# Patient Record
Sex: Female | Born: 1960 | Race: Black or African American | Hispanic: No | Marital: Single | Smoking: Former smoker
Health system: Southern US, Community
[De-identification: ages and names within clinical notes are randomized; demographics above are authoritative.]

## PROBLEM LIST (undated history)

## (undated) DIAGNOSIS — D573 Sickle-cell trait: Secondary | ICD-10-CM

## (undated) DIAGNOSIS — C539 Malignant neoplasm of cervix uteri, unspecified: Secondary | ICD-10-CM

## (undated) DIAGNOSIS — E119 Type 2 diabetes mellitus without complications: Secondary | ICD-10-CM

## (undated) DIAGNOSIS — I10 Essential (primary) hypertension: Secondary | ICD-10-CM

## (undated) DIAGNOSIS — M1990 Unspecified osteoarthritis, unspecified site: Secondary | ICD-10-CM

## (undated) DIAGNOSIS — E785 Hyperlipidemia, unspecified: Secondary | ICD-10-CM

## (undated) HISTORY — DX: Type 2 diabetes mellitus without complications: E11.9

## (undated) HISTORY — DX: Unspecified osteoarthritis, unspecified site: M19.90

## (undated) HISTORY — DX: Essential (primary) hypertension: I10

## (undated) HISTORY — DX: Malignant neoplasm of cervix uteri, unspecified: C53.9

## (undated) HISTORY — DX: Hyperlipidemia, unspecified: E78.5

## (undated) HISTORY — PX: BACK SURGERY: SHX140

## (undated) HISTORY — DX: Sickle-cell trait: D57.3

## (undated) HISTORY — PX: JOINT REPLACEMENT: SHX530

## (undated) HISTORY — PX: COLONOSCOPY: SHX174

---

## 1998-09-02 ENCOUNTER — Other Ambulatory Visit: Admission: RE | Admit: 1998-09-02 | Discharge: 1998-09-02 | Payer: Self-pay | Admitting: Obstetrics and Gynecology

## 1998-09-05 ENCOUNTER — Inpatient Hospital Stay (HOSPITAL_COMMUNITY): Admission: AD | Admit: 1998-09-05 | Discharge: 1998-09-05 | Payer: Self-pay | Admitting: Obstetrics and Gynecology

## 1998-09-05 ENCOUNTER — Inpatient Hospital Stay (HOSPITAL_COMMUNITY): Admission: AD | Admit: 1998-09-05 | Discharge: 1998-09-05 | Payer: Self-pay | Admitting: *Deleted

## 1998-09-20 ENCOUNTER — Other Ambulatory Visit: Admission: RE | Admit: 1998-09-20 | Discharge: 1998-09-20 | Payer: Self-pay | Admitting: Obstetrics and Gynecology

## 1998-09-22 ENCOUNTER — Ambulatory Visit: Admission: RE | Admit: 1998-09-22 | Discharge: 1998-09-22 | Payer: Self-pay | Admitting: Gynecology

## 1998-09-22 ENCOUNTER — Encounter: Payer: Self-pay | Admitting: Gynecology

## 1998-09-25 ENCOUNTER — Encounter: Payer: Self-pay | Admitting: Obstetrics and Gynecology

## 1998-09-25 ENCOUNTER — Ambulatory Visit (HOSPITAL_COMMUNITY): Admission: RE | Admit: 1998-09-25 | Discharge: 1998-09-25 | Payer: Self-pay | Admitting: Obstetrics and Gynecology

## 1998-09-29 ENCOUNTER — Encounter: Payer: Self-pay | Admitting: Gynecology

## 1998-09-29 ENCOUNTER — Ambulatory Visit: Admission: RE | Admit: 1998-09-29 | Discharge: 1998-09-29 | Payer: Self-pay | Admitting: Gynecology

## 1998-10-20 ENCOUNTER — Ambulatory Visit: Admission: RE | Admit: 1998-10-20 | Discharge: 1998-10-20 | Payer: Self-pay | Admitting: Gynecology

## 1998-11-01 ENCOUNTER — Encounter: Admission: RE | Admit: 1998-11-01 | Discharge: 1999-01-30 | Payer: Self-pay | Admitting: Radiation Oncology

## 1998-11-02 ENCOUNTER — Ambulatory Visit: Admission: RE | Admit: 1998-11-02 | Discharge: 1998-11-02 | Payer: Self-pay | Admitting: Gynecology

## 1998-11-17 ENCOUNTER — Ambulatory Visit: Admission: RE | Admit: 1998-11-17 | Discharge: 1998-11-17 | Payer: Self-pay | Admitting: Gynecology

## 1999-01-11 ENCOUNTER — Ambulatory Visit: Admission: RE | Admit: 1999-01-11 | Discharge: 1999-01-11 | Payer: Self-pay | Admitting: Gynecologic Oncology

## 1999-01-25 ENCOUNTER — Ambulatory Visit: Admission: RE | Admit: 1999-01-25 | Discharge: 1999-01-25 | Payer: Self-pay | Admitting: Gynecology

## 1999-04-12 ENCOUNTER — Other Ambulatory Visit: Admission: RE | Admit: 1999-04-12 | Discharge: 1999-04-12 | Payer: Self-pay | Admitting: Gynecology

## 1999-04-12 ENCOUNTER — Ambulatory Visit: Admission: RE | Admit: 1999-04-12 | Discharge: 1999-04-12 | Payer: Self-pay | Admitting: Gynecology

## 1999-07-13 ENCOUNTER — Ambulatory Visit: Admission: RE | Admit: 1999-07-13 | Discharge: 1999-07-13 | Payer: Self-pay | Admitting: Gynecologic Oncology

## 1999-07-15 ENCOUNTER — Other Ambulatory Visit: Admission: RE | Admit: 1999-07-15 | Discharge: 1999-07-15 | Payer: Self-pay | Admitting: Gynecologic Oncology

## 1999-10-18 ENCOUNTER — Ambulatory Visit: Admission: RE | Admit: 1999-10-18 | Discharge: 1999-10-18 | Payer: Self-pay | Admitting: Gynecology

## 1999-10-19 ENCOUNTER — Other Ambulatory Visit: Admission: RE | Admit: 1999-10-19 | Discharge: 1999-10-19 | Payer: Self-pay | Admitting: Gynecology

## 2000-01-24 ENCOUNTER — Ambulatory Visit (HOSPITAL_COMMUNITY): Admission: RE | Admit: 2000-01-24 | Discharge: 2000-01-24 | Payer: Self-pay | Admitting: *Deleted

## 2000-01-24 ENCOUNTER — Encounter: Payer: Self-pay | Admitting: *Deleted

## 2000-03-28 ENCOUNTER — Ambulatory Visit: Admission: RE | Admit: 2000-03-28 | Discharge: 2000-03-28 | Payer: Self-pay | Admitting: Gynecology

## 2000-03-28 ENCOUNTER — Other Ambulatory Visit: Admission: RE | Admit: 2000-03-28 | Discharge: 2000-03-28 | Payer: Self-pay | Admitting: Gynecology

## 2000-07-18 ENCOUNTER — Other Ambulatory Visit: Admission: RE | Admit: 2000-07-18 | Discharge: 2000-07-18 | Payer: Self-pay | Admitting: Gynecology

## 2000-07-18 ENCOUNTER — Ambulatory Visit: Admission: RE | Admit: 2000-07-18 | Discharge: 2000-07-18 | Payer: Self-pay | Admitting: Gynecology

## 2001-02-27 ENCOUNTER — Other Ambulatory Visit: Admission: RE | Admit: 2001-02-27 | Discharge: 2001-02-27 | Payer: Self-pay | Admitting: Gynecology

## 2001-02-27 ENCOUNTER — Ambulatory Visit: Admission: RE | Admit: 2001-02-27 | Discharge: 2001-02-27 | Payer: Self-pay | Admitting: Gynecology

## 2001-03-11 ENCOUNTER — Ambulatory Visit (HOSPITAL_COMMUNITY): Admission: RE | Admit: 2001-03-11 | Discharge: 2001-03-11 | Payer: Self-pay | Admitting: *Deleted

## 2001-06-04 ENCOUNTER — Ambulatory Visit: Admission: RE | Admit: 2001-06-04 | Discharge: 2001-06-04 | Payer: Self-pay | Admitting: Gynecology

## 2001-06-04 ENCOUNTER — Encounter (INDEPENDENT_AMBULATORY_CARE_PROVIDER_SITE_OTHER): Payer: Self-pay | Admitting: *Deleted

## 2001-06-04 ENCOUNTER — Other Ambulatory Visit: Admission: RE | Admit: 2001-06-04 | Discharge: 2001-06-04 | Payer: Self-pay | Admitting: Gynecology

## 2002-01-29 ENCOUNTER — Other Ambulatory Visit: Admission: RE | Admit: 2002-01-29 | Discharge: 2002-01-29 | Payer: Self-pay | Admitting: Gynecologic Oncology

## 2002-01-29 ENCOUNTER — Ambulatory Visit: Admission: RE | Admit: 2002-01-29 | Discharge: 2002-01-29 | Payer: Self-pay | Admitting: Gynecologic Oncology

## 2002-03-17 ENCOUNTER — Ambulatory Visit (HOSPITAL_COMMUNITY): Admission: RE | Admit: 2002-03-17 | Discharge: 2002-03-17 | Payer: Self-pay | Admitting: Gynecologic Oncology

## 2002-06-11 ENCOUNTER — Other Ambulatory Visit: Admission: RE | Admit: 2002-06-11 | Discharge: 2002-06-11 | Payer: Self-pay | Admitting: Gynecology

## 2002-06-11 ENCOUNTER — Ambulatory Visit: Admission: RE | Admit: 2002-06-11 | Discharge: 2002-06-11 | Payer: Self-pay | Admitting: Gynecology

## 2002-09-03 ENCOUNTER — Emergency Department (HOSPITAL_COMMUNITY): Admission: EM | Admit: 2002-09-03 | Discharge: 2002-09-03 | Payer: Self-pay | Admitting: *Deleted

## 2002-10-16 ENCOUNTER — Encounter: Payer: Self-pay | Admitting: Orthopedic Surgery

## 2002-10-21 ENCOUNTER — Inpatient Hospital Stay (HOSPITAL_COMMUNITY): Admission: RE | Admit: 2002-10-21 | Discharge: 2002-10-25 | Payer: Self-pay | Admitting: Orthopedic Surgery

## 2002-10-21 ENCOUNTER — Encounter: Payer: Self-pay | Admitting: Orthopedic Surgery

## 2002-10-22 ENCOUNTER — Encounter: Payer: Self-pay | Admitting: Orthopedic Surgery

## 2003-01-06 ENCOUNTER — Ambulatory Visit: Admission: RE | Admit: 2003-01-06 | Discharge: 2003-01-06 | Payer: Self-pay | Admitting: Gynecology

## 2003-01-06 ENCOUNTER — Encounter (INDEPENDENT_AMBULATORY_CARE_PROVIDER_SITE_OTHER): Payer: Self-pay | Admitting: *Deleted

## 2003-01-06 ENCOUNTER — Other Ambulatory Visit: Admission: RE | Admit: 2003-01-06 | Discharge: 2003-01-06 | Payer: Self-pay | Admitting: Gynecology

## 2003-01-26 ENCOUNTER — Encounter: Payer: Self-pay | Admitting: Orthopedic Surgery

## 2003-01-26 ENCOUNTER — Encounter: Admission: RE | Admit: 2003-01-26 | Discharge: 2003-01-26 | Payer: Self-pay | Admitting: Orthopedic Surgery

## 2003-03-23 ENCOUNTER — Encounter: Payer: Self-pay | Admitting: Gynecology

## 2003-03-23 ENCOUNTER — Ambulatory Visit (HOSPITAL_COMMUNITY): Admission: RE | Admit: 2003-03-23 | Discharge: 2003-03-23 | Payer: Self-pay | Admitting: Gynecology

## 2003-04-27 ENCOUNTER — Encounter: Admission: RE | Admit: 2003-04-27 | Discharge: 2003-04-27 | Payer: Self-pay | Admitting: Orthopedic Surgery

## 2003-05-01 ENCOUNTER — Ambulatory Visit (HOSPITAL_COMMUNITY): Admission: RE | Admit: 2003-05-01 | Discharge: 2003-05-01 | Payer: Self-pay | Admitting: Orthopedic Surgery

## 2004-03-23 ENCOUNTER — Ambulatory Visit (HOSPITAL_COMMUNITY): Admission: RE | Admit: 2004-03-23 | Discharge: 2004-03-23 | Payer: Self-pay | Admitting: Gynecology

## 2004-08-03 ENCOUNTER — Encounter (INDEPENDENT_AMBULATORY_CARE_PROVIDER_SITE_OTHER): Payer: Self-pay | Admitting: *Deleted

## 2004-08-03 LAB — CONVERTED CEMR LAB

## 2004-08-09 ENCOUNTER — Ambulatory Visit: Payer: Self-pay | Admitting: Family Medicine

## 2004-08-09 ENCOUNTER — Encounter (INDEPENDENT_AMBULATORY_CARE_PROVIDER_SITE_OTHER): Payer: Self-pay | Admitting: Specialist

## 2005-03-29 ENCOUNTER — Emergency Department (HOSPITAL_COMMUNITY): Admission: EM | Admit: 2005-03-29 | Discharge: 2005-03-29 | Payer: Self-pay | Admitting: Emergency Medicine

## 2005-04-17 ENCOUNTER — Emergency Department (HOSPITAL_COMMUNITY): Admission: EM | Admit: 2005-04-17 | Discharge: 2005-04-17 | Payer: Self-pay | Admitting: Family Medicine

## 2005-05-08 ENCOUNTER — Ambulatory Visit (HOSPITAL_COMMUNITY): Admission: RE | Admit: 2005-05-08 | Discharge: 2005-05-08 | Payer: Self-pay | Admitting: Gynecology

## 2005-06-05 DIAGNOSIS — C539 Malignant neoplasm of cervix uteri, unspecified: Secondary | ICD-10-CM

## 2005-06-05 HISTORY — PX: ABDOMINAL HYSTERECTOMY: SHX81

## 2005-06-05 HISTORY — DX: Malignant neoplasm of cervix uteri, unspecified: C53.9

## 2006-02-14 ENCOUNTER — Emergency Department (HOSPITAL_COMMUNITY): Admission: EM | Admit: 2006-02-14 | Discharge: 2006-02-14 | Payer: Self-pay | Admitting: Emergency Medicine

## 2006-05-21 ENCOUNTER — Ambulatory Visit (HOSPITAL_COMMUNITY): Admission: RE | Admit: 2006-05-21 | Discharge: 2006-05-21 | Payer: Self-pay | Admitting: Gynecology

## 2006-08-02 DIAGNOSIS — M25559 Pain in unspecified hip: Secondary | ICD-10-CM | POA: Insufficient documentation

## 2006-08-02 DIAGNOSIS — C539 Malignant neoplasm of cervix uteri, unspecified: Secondary | ICD-10-CM | POA: Insufficient documentation

## 2006-08-03 ENCOUNTER — Encounter (INDEPENDENT_AMBULATORY_CARE_PROVIDER_SITE_OTHER): Payer: Self-pay | Admitting: *Deleted

## 2007-08-13 ENCOUNTER — Emergency Department (HOSPITAL_COMMUNITY): Admission: EM | Admit: 2007-08-13 | Discharge: 2007-08-13 | Payer: Self-pay | Admitting: Family Medicine

## 2007-09-16 ENCOUNTER — Emergency Department (HOSPITAL_COMMUNITY): Admission: EM | Admit: 2007-09-16 | Discharge: 2007-09-16 | Payer: Self-pay | Admitting: Family Medicine

## 2008-12-20 ENCOUNTER — Emergency Department (HOSPITAL_COMMUNITY): Admission: EM | Admit: 2008-12-20 | Discharge: 2008-12-20 | Payer: Self-pay | Admitting: Emergency Medicine

## 2008-12-21 ENCOUNTER — Emergency Department (HOSPITAL_COMMUNITY): Admission: EM | Admit: 2008-12-21 | Discharge: 2008-12-21 | Payer: Self-pay | Admitting: Family Medicine

## 2009-02-01 ENCOUNTER — Emergency Department (HOSPITAL_COMMUNITY): Admission: EM | Admit: 2009-02-01 | Discharge: 2009-02-01 | Payer: Self-pay | Admitting: Family Medicine

## 2009-08-24 ENCOUNTER — Ambulatory Visit (HOSPITAL_COMMUNITY): Admission: RE | Admit: 2009-08-24 | Discharge: 2009-08-24 | Payer: Self-pay | Admitting: Obstetrics & Gynecology

## 2010-06-25 ENCOUNTER — Encounter: Payer: Self-pay | Admitting: Orthopedic Surgery

## 2010-06-26 ENCOUNTER — Encounter: Payer: Self-pay | Admitting: Gynecology

## 2010-06-26 ENCOUNTER — Encounter: Payer: Self-pay | Admitting: Obstetrics

## 2010-10-21 NOTE — Consult Note (Signed)
NAMESIHAAM, CHROBAK NO.:  [id number]   MEDICAL RECORD NO.:  [id number]                   PATIENT TYPE:  OUT   LOCATION:  GYN                                  FACILITY:  [facility]   PHYSICIAN:  De Blanch, M.D.         DATE OF BIRTH:  04/08/1961   DATE OF CONSULTATION:  06/11/2002  DATE OF DISCHARGE:  06/11/2002                                   CONSULTATION   A 50 year old white female returns for continuing follow-up of a stage IB  cervical carcinoma.  She underwent radical hysterectomy in May of 2000 and  she had a metastasis in January 2004 right pelvic lymph nodes and deep  stromal invasion.  She subsequently received postoperative radiation therapy  completed in July of 2000.  She did not chose to take concurrent  chemotherapy.  She has done reasonably well, although she has had some  difficulties with her bladder and has undergone initial evaluation by Excell Seltzer. Annabell Howells, M.D., although she did not follow through with the complete work-  up.   The patient reports she does have some vaginal bleeding occurring for one  day approximately every two to three months.  She has noticed that this is  not associated with intercourse and is painless.  Work-up to date of this  problem has been nonrevealing.   The patient reports she has no GI symptoms.  Her urinary tract symptoms with  regard to urinary frequency and nocturia and intermittent hematuria seem to  be stable.  Overall, she seems to be doing reasonably well.   FAMILY HISTORY:  Reviewed and unchanged.   SOCIAL HISTORY:  Reviewed and unchanged.  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   REVIEW OF SYSTEMS:  GENITOURINARY:  Noted above.  She has no other GI,  cardiovascular, pulmonary, neurologic, or musculoskeletal symptoms.   PHYSICAL EXAMINATION:  VITAL SIGNS:  Weight 240 pounds, blood pressure  118/80.  GENERAL:  The patient is a healthy African-American female in  no acute  distress.  HEENT:  Negative.  NECK:  Supple without thyromegaly.  LYMPH:  There is no supraclavicular or inguinal adenopathy.  ABDOMEN:  Soft, nontender.  No masses, organomegaly, ascites, or hernias are  noted.  PELVIC:  EGBUS is normal.  The vagina is reasonably well estrogenized,  although near the apex it is somewhat thinner and appears to be slightly  more friable.  No lesions are noted.  There is no nodularity.  Bimanual and  rectovaginal examination reveal no masses, induration, or nodularity.   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  No evidence of recurrent  disease.   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   The patient is given new prescription for Premarin vaginal cream and  encouraged to use this on a regular basis at  least twice a week.  She also  notes some areas of erythema and excoriation beneath her panniculus and  breasts.  She is given a prescription for Nycop to be used b.i.d. in these  areas.  She will return to see [place] in six months.   Finally, she is encouraged to rearrange appointment with Excell Seltzer. Annabell Howells, M.D.  so she can have further evaluation of her bladder problems.                                               De Blanch, M.D.    DC/MEDQ  D:  06/16/2002  T:  06/16/2002  Job:  [id number]   cc:   Excell Seltzer. Annabell Howells, M.D.  [address]  [place]  [place] [address]  Fax: [phone]   Telford Nab, R.N.  [address] [place], [place] [address]  Fax: 1   Billie Lade, M.D.  [address] [facility]  [place]  [place]  [address]  Fax: [phone]   Dineen Kid. Rana Snare, M.D.  [address]  [place]  [place] [address]  Fax: [phone]

## 2010-10-21 NOTE — Consult Note (Signed)
[facility]  Patient:    [name], [name]                       MRN: [id number] Proc. Date: 03/28/00 Adm. Date:  [id number] Disc. Date: [id number] Attending:  Jeannette Corpus                          Consultation Report  GYNECOLOGIC ONCOLOGY CLINIC  [age]-year-old African-American female returns for continuing followup of stage IB cervical carcinoma.  She underwent a radical hysterectomy in May of 2000.  She was found to have metastatic disease in one of four right pelvis lymph nodes.  Her tumor was also deeply invasive; therefore, she received postoperative pelvic radiation therapy to a total dose of 4500 cGy, completed in July of 2000.  Patient refused to take on current chemotherapy despite our strong recommendations.  Since our last visit with Ms. Viets, she has done reasonably well.  She is concerned about her weight gain and a decrease in her libido.  She also has some urinary frequency but no dysuria.  REVIEW OF SYSTEMS:  Review of systems is otherwise negative.  FAMILY HISTORY AND SOCIAL HISTORY:  Reviewed and unchanged.  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  PHYSICAL EXAMINATION  VITAL SIGNS:  Weight 235 pounds (up 11 pounds since May of 2001).  GENERAL:  The patient is a healthy black female in no acute distress.  HEENT:  Negative.  NECK:  Supple without thyromegaly.  There is no supraclavicular, axillary or inguinal adenopathy.  ABDOMEN:  The abdomen is obese, soft and nontender.  No masses, organomegaly, ascites or herniae are noted.  Her incision is well-healed.  PELVIC:  EG/BUS normal.  Vagina is clean, well-supported.  Bimanual and rectovaginal exam revealed no mass, induration or nodularity.  IMPRESSION:  Stage IB cervical cancer, status post radical hysterectomy and pelvic radiation therapy; no evidence of recurrent disease.  PLAN:  Pap smears are obtained today.  Because of her urinary symptoms, a urinalysis is  obtained.  With regard to the patients decreased libido, it also is noted she is having hot flushes; she is therefore given a prescription for Estratest HS and Premarin vaginal cream to be used twice a week.  She will return to see [place] again in six months for continued followup. DD:  04/10/00 TD:  04/10/00 Job: [id number]

## 2010-10-21 NOTE — Consult Note (Signed)
[facility]  Patient:    [name], [name]                         MRN: [id number] Proc. Date: 10/18/99 Attending:  Rande Brunt. Clarke-Pearson, M.D. CC:         Billie Lade, M.D.             Trevor Iha, M.D.             Telford Nab, R.N.                          Consultation Report  HISTORY OF PRESENT ILLNESS:  This 50 year old African-American female returns for continuing follow-up of stage IB cervical carcinoma. She underwent a radical hysterectomy in May of 2000. She was found to have metastatic disease in 1 of 4 right pelvic lymph nodes and a deeply invasive tumor. She received postoperative pelvic radiation therapy delivered to the whole pelvis (4500 cGy) which she completed on December 22, 1998. She refused to receive concurrent chemotherapy.  Since her last visit, she has done well. She denies any GI symptoms. She has some vaginal discharge and slight burning when she urinates. She denies any flank pain or fever or nocturia.  FAMILY AND SOCIAL HISTORY:  Reviewed and unchanged from note of May 2000 . 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  ALLERGIES:  PENICILLIN.  CURRENT MEDICATIONS:  None.  PHYSICAL EXAMINATION:  VITAL SIGNS:  Weight 224 pounds (up 6 pounds since February), blood pressure 130/80.  GENERAL:  The patient is a healthy, obese black female in no acute distress.  HEENT:  Negative.  NECK:  Supple without thyromegaly. There is no supraclavicular, axillary or inguinal adenopathy. There is no CVA tenderness.  ABDOMEN:  Obese and soft, nontender. No mass, organomegaly, ascites or hernias are noted.  PELVIC:  EGBUS normal. Vagina has a nonspecific vaginitis discharge. There are no lesions noted. Bimanual and rectovaginal exam reveal no masses, indurations or nodularity.  EXTREMITIES:  The lower extremities are without edema or varicosities.  IMPRESSION:  Stage IB squamous cell  carcinoma of the cervix status post radical hysterectomy followed by whole pelvis radiation therapy. She remains without evidence of disease.  Nonspecific vaginitis.  PLAN:  Pap smears are obtained. The patient is given a prescription for Flagyl 250 mg t.i.d. for 5 days. She will return to see me in 4 months for continuing follow-up. DD:  10/18/99 TD:  10/18/99 Job: [id number]

## 2010-10-21 NOTE — Discharge Summary (Signed)
   [name], [name]                          ACCOUNT NO.:  [id number]   MEDICAL RECORD NO.:  [id number]                   PATIENT TYPE:  INP   LOCATION:  5002                                 FACILITY:  [facility]   PHYSICIAN:  Burnard Bunting, M.D.                 DATE OF BIRTH:  May 09, 1961   DATE OF ADMISSION:  10/21/2002  DATE OF DISCHARGE:  10/25/2002                                 DISCHARGE SUMMARY   DISCHARGE DIAGNOSIS:  Left hip arthritis.   SECONDARY DIAGNOSES:  History of cervical cancer status post radiation.   OPERATIONS/PROCEDURE:  Left total hip arthroplasty with hardware removal  performed Oct 21, 2002.   HOSPITAL COURSE:  The patient was admitted to the orthopedic service Oct 21, 2002.  At that time, she underwent hardware removal and left total hip  arthroplasty.  The patient tolerated the procedure well without immediate  complications.  She did have some left upper arm and chest pain on  postoperative day #1 which was evaluated by the medical service.  This was  negative for any type of cardiac event.  On postoperative day #1, her leg  lengths were  equal.  Dorsiflexion and plantar flexion was intact.  The  patient's hematocrit was 27.2 on Oct 23, 2002.  She was maintained on  Coumadin for DVT prophylaxis as well as bilateral foot pumps.  Incision was  intact at the time of discharge.  X-ray showed components to be in good  position.  The patient was discharged on Oct 25, 2002 in good condition.  She will be partial weightbearing on that leg for three weeks.   DISCHARGE MEDICATIONS:  Include Percocet and Coumadin as well as admission  medications.   She will return to clinic in one week for suture removal.  She is instructed  with hip precaution by physical therapy.                                                 Burnard Bunting, M.D.    GSD/MEDQ  D:  11/24/2002  T:  11/25/2002  Job:  [id number]

## 2010-10-21 NOTE — Consult Note (Signed)
NAMECYTHINA, [name]                          ACCOUNT NO.:  [id number]   MEDICAL RECORD NO.:  [id number]                   PATIENT TYPE:  OUT   LOCATION:  GYN                                  FACILITY:  [facility]   PHYSICIAN:  John T. Kyla Balzarine, M.D.                 DATE OF BIRTH:  Jul 07, 1960   DATE OF CONSULTATION:  DATE OF DISCHARGE:                                 GYN CONSULTATION   CHIEF COMPLAINT:  Ongoing followup of cervical cancer with intermittent  vaginal bleeding.   INTERVAL HISTORY:  The patient notes intermittent vaginal spotting and  occasional blood in urine.  She denies any trauma, and denies postcoital  bleeding.  She has been using oral and vaginal Premarin, although the  vaginal Premarin has been somewhat intermittent, particularly revolving  around a hip repair for avascular necrosis which took place last spring at  [facility].   PAST GYNECOLOGICAL ONCOLOGY HISTORY:  Stage 1B cervical cancer treated by  radical hysterectomy in 5/00, treated with 4500 centigray of the whole  pelvis completed in 7/00.  She has been known to have radiation change at  the upper vagina.   MEDICATIONS:  Premarin, oral and vaginal.   PAST SURGICAL HISTORY:  1. Radical hysterectomy.  2. 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   ALLERGIES:  PENICILLIN.  Past history, personal and social history, family history, and review of  systems are unchanged from those recorded during our inpatient evaluation.   PHYSICAL EXAMINATION:  VITAL SIGNS:  Weight 238 pounds, and vital signs are  stable/afebrile.  GENERAL:  The patient is alert and oriented x3, in no acute distress.  HEENT:  Benign with a clear oropharynx.  NECK:  There is no pathologic lymphadenopathy.  BACK:  There is no back or CVA tenderness.  ABDOMEN:  Soft and benign without ascites, mass, or organomegaly.  Incision  is well-healed.  No hernias.  Nontender.  EXTREMITIES:  Full range of motion.  There is scarring along the left  lateral hip from prior hip repair.  PELVIC:  External genitalia, BUS are normal to inspection and palpable.  The  bladder and urethra are well supported by the vagina and there is minimal  erythema at the apex, with some contact spotting, but no overt viability.  The patient is absent uterus and cervix.  Perimetria and adnexa, no mass or  nodularity.  RECTAL:  Confirmatory.   ASSESSMENT:  Cervical carcinoma, NED.  With likely radiation effect in the  upper vagina; normal examination.    PLAN:  1. The patient will increase her Premarin vaginal cream to 1 g b.i.w.  2. She will return for routine evaluation in our unit in one year.  John T. Kyla Balzarine, M.D.    JTS/MEDQ  D:  01/29/2002  T:  01/30/2002  Job:  [id number]   cc:   Telford Nab, R.N.

## 2010-10-21 NOTE — Op Note (Signed)
NAMETALLI, KIMMER                          ACCOUNT NO.:  [id number]   MEDICAL RECORD NO.:  [id number]                   PATIENT TYPE:  INP   LOCATION:  5002                                 FACILITY:  [facility]   PHYSICIAN:  Burnard Bunting, M.D.                 DATE OF BIRTH:  1961/03/19   DATE OF PROCEDURE:  10/21/2002  DATE OF DISCHARGE:                                 OPERATIVE REPORT   PREOPERATIVE DIAGNOSIS:  Left hip arthritis, status post failed free  vascularized fibular grafting.   POSTOPERATIVE DIAGNOSIS:  Left hip arthritis, status post failed free  vascularized fibular grafting.   PROCEDURE:  Left total hip arthroplasty utilizing Press-Fit component size  #50 acetabular cup with ceramic and size #814 Press-Fit thin #2 aluminum  hardware, left hip.   SURGEON:  Burnard Bunting, M.D.   ANESTHESIA:  General endotracheal.   ESTIMATED BLOOD LOSS:  500 mL.   DRAINS:  None.   DESCRIPTION OF PROCEDURE:  The patient was brought to the operating room  where general endotracheal anesthesia was induced.  Preoperative IV  antibiotics were administered.  The left hip and leg and foot were prepped  with DuraPrep solution and draped in a sterile manner.  The patient was  placed in the lateral position with an axillary roll placed under the right  axilla, and the peroneal nerve on the right was well-padded.  The leg, hip  and foot were then prepped with DuraPrep solution and draped in a sterile  manner.  Collier Flowers was used to cover the operative field.  A posterior approach  to the hip was utilized.  The skin and subcutaneous tissue were sharply  divided.  The fascia lata was encountered.  With the hip abducted the fascia  lata was divided.  Significant scar tissue was adherent to the trochanteric  bursa.  This was then developed over the plane with the Cobb elevator.  The  sciatic nerve was palpated and protected at all times during the remaining  portion of the case.  The external  rotators were identified.  The piriformis  tendon was tagged and retracted.  The external rotators were then detached  off of the capsule using a Cobb elevator and electrocautery.  The capsule  was then identified and divided as a T-shaped flap from the acetabulum and  tagged with two #1 sutures.  The hip was then dislocated.  The femoral neck  cut was then made according to the preoperative templating about a  fingerbreadth proximal to the graft in the left trochanter.  This was  performed with the oscillating saw with the __________ retractor around the  femoral neck.  The head was removed.  Segmental collapse was noted.  At this  time the femoral canal was prepared using a bur.  The remaining portion of  the fibula was burred down.  This required intramedullary placement of the  bur in order to remove the hard fibular bone from the canal.  The canal was  then subsequently reamed and broached up to accept a size #8 stem.  At this  time the attention was directed towards the acetabulum.  The labrum was  removed.  The bony border of the acetabulum was identified.  The acetabulum  was then reamed to accept a size #50 cup.  A trial cup was placed, and found  to have excellent Press-Fit.  The true cup was then tapped into position  with bottoming-out noted.  The trial liner was then placed.  At this time  the size #8 broach was placed and the hip was reduced and was found to be  stable in both +0 and +5 heads.  At this time the distal femoral canal was  reamed up to a size #14.5 with good __________ encountered.  At this time  the cold handle broach was placed and the true component was then tapped  into position.  The Press-Fit femur was then tapped in at about 20 degrees  of anteversion.  The hip was again reduced with a +0 and +5 head and found  to be stable in external rotation, abduction, stable in the position of  sleep, stable with 90 degrees abduction, about 60 degrees of internal   rotation.  At this time a postoperative fluoroscopy was utilized to confirm  good leg lengths and the placement of the cup and the femur.  The incision  was thoroughly irrigated at this time and the true component was placed.  A  +5 head gave excellent stability and good offset.  The hip stability  parameters were maintained.  At this time the +5 ceramic head was placed.  The head was tapped into position.  Stability parameters were the same.  The  joint was then again irrigated with pulsatile lavage.  The split in the T-  capsule was then reapproximated.  The fascia lata was then reapproximated.  It was then reapproximated using a #1 Vicryl suture.  The sciatic nerve was  palpated and was found to have a good tension with the leg flexion and  extension.  The skin was closed using inverted #0 and #2-0 Vicryl sutures,  followed by skin staples.  An impervious dressing was then placed.  The patient was then transferred to the recovery room in stable condition.                                                 Burnard Bunting, M.D.    GSD/MEDQ  D:  10/21/2002  T:  10/21/2002  Job:  [id number]

## 2010-10-21 NOTE — Consult Note (Signed)
[name], [name] NO.:  [id number]   MEDICAL RECORD NO.:  [id number]                   PATIENT TYPE:  OUT   LOCATION:  GYN                                  FACILITY:  [facility]   PHYSICIAN:  De Blanch, M.D.         DATE OF BIRTH:  Feb 27, 1961   DATE OF CONSULTATION:  DATE OF DISCHARGE:  01/06/2003                                   CONSULTATION   REASON FOR CONSULTATION:  A 51 year old African-American female who returns  for continuing follow-up of cervical cancer.   INTERVAL HISTORY:  Since her last visit the patient has had no gynecologic  symptoms.  She specifically denies any pelvic pain, pressure, GI, or GU  symptoms.  She has no vaginal bleeding.   She has recently had a left total hip replacement performed in [place]  and she seems to be recovering from this well.   HISTORY OF PRESENT ILLNESS:  The patient underwent radical hysterectomy in  May 2000 for a stage Ib cervical cancer.  She had metastasis to pelvic lymph  nodes and subsequently received whole pelvis radiation therapy which was  completed in July 2000.  She refused concurrent chemotherapy during that  initial treatment.   PAST MEDICAL HISTORY:  Obesity and hypertension.   CURRENT MEDICATIONS:  Pain medication for hip pain.   PAST SURGICAL HISTORY:  1. Radical hysterectomy in 2000.  2. Bone graft to her hip, and subsequent total hip replacement.   SOCIAL HISTORY:  The patient smokes less than a pack a day.  She works as a  Financial risk analyst.  Does not drink.   FAMILY HISTORY:  Negative for gynecologic or colon cancer.  Her mother died  of breast cancer.   REVIEW OF SYSTEMS:  Negative except as noted above.   PHYSICAL EXAMINATION:  VITAL SIGNS:  Weight 243 pounds, blood pressure  123/88, pulse 96.  GENERAL:  The patient is a healthy African-American female in no acute  distress.  HEENT:  Negative.  NECK:  Supple without thyromegaly.  There is no supraclavicular or  inguinal  adenopathy.  ABDOMEN:  Obese, soft, nontender.  No mass, organomegaly, ascites, or  hernias noted.  The midline is well healed.  PELVIC:  EG/BUS, vagina, bladder, urethra are normal.  Cervix and uterus is  surgically absent.  There are no lesions in the vagina.  Bimanual reveals  radiation fibrosis without any nodularity or masses.  Rectovaginal exam  confirms.    IMPRESSION:  Stage Ib cervical cancer status post radical hysterectomy and  adjuvant radiation therapy for positive pelvic lymph nodes, completed in  July 2000.  The patient is now four years following completion of therapy  and is doing well.   Pap smears were obtained today.  She will return to see [place] in one year for  continuing surveillance.  De Blanch, M.D.    DC/MEDQ  D:  01/08/2003  T:  01/08/2003  Job:  [id number]   cc:   Telford Nab, R.N.  [address]  [place], [place] [address]   Excell Seltzer. Annabell Howells, M.D.  [address]  [place]  [place] [address]  Fax: [phone]

## 2010-10-21 NOTE — Consult Note (Signed)
[facility]  Patient:    [name], [name] Visit Number: [id number] MRN: [id number]          Service Type: GON Location: GYN Attending Physician:  Jeannette Corpus Dictated by:   Rande Brunt. Clarke-Pearson, M.D. Proc. Date: 06/04/01 Admit Date:  06/04/2001                            Consultation Report  REASON FOR FOLLOWUP:  [age]-year-old black female returns today because of recurrent bad episodes of vaginal bleeding.  This has occurred on a couple of occasions over the past two weeks.  She denies any pelvic pain, pressure or any new bladder symptoms.  She does have some chronic bladder dysfunction but this is stable.  She denies any trauma and the bleeding did not occur after intercourse.  She is not 100% sure whether this is from her vagina or from her bladder.  She does not really have any other new bladder symptoms.  REVIEW OF SYSTEMS:  Review of systems is essentially negative.  She is scheduled to have hip surgery at [facility] later this coming month.  FAMILY HISTORY AND SOCIAL HISTORY:  Unchanged.  PHYSICAL EXAMINATION:  VITAL SIGNS:  Weight 234 pounds (up 8 pounds since last visit).  GENERAL:  Patient is a pleasant, healthy black female in no acute distress.  ABDOMEN:  Obese, soft and nontender.  No masses, organomegaly, ascites or herniae are noted.  PELVIC:  EGBUS are normal.  The urethra appears normal.  Inspection of the vagina shows some thinning at the apex of the vagina which is easily friable. No lesions are noted.  Pap smear is obtained.  Bimanual and rectovaginal exam reveal no masses or nodularity.  Stool is guaiac negative.  IMPRESSION:  Past history of stage IB cervical cancer treated by radical hysterectomy, May of 2000.  She had one pelvic lymph node involved and received 4500 cGy to the whole pelvis, completed in July of 2000.  I do not find any evidence of recurrent disease today.  It looks as  though she has atrophic mucosa at the apex of the vagina.  PLAN:  The patient is given a prescription for Premarin vaginal cream to be used twice a week for the next three months.  She will contact us if she has any further bleeding; otherwise, she will return in six months for routine followup. Dictated by:   Rande Brunt. Clarke-Pearson, M.D. Attending Physician:  Jeannette Corpus DD:  06/05/01 TD:  06/05/01 Job: [id number]

## 2010-10-21 NOTE — Consult Note (Signed)
[facility]  Patient:    [name], [name]                       MRN: [id number] Proc. Date: 07/18/00 Adm. Date:  [id number] Attending:  Jeannette Corpus                          Consultation Report  HISTORY:  [age]-year-old African-American female returns for continuing followup of cervical cancer.  She had a radical hysterectomy in May of 2000 for stage IB cervical cancer.  She had metastatic disease in one-of-four right pelvic lymph nodes and deep invasion.  She received 4500 cGy to the whole pelvis thereafter, which was completed in July of 2000.  She did not wish to have concurrent chemotherapy.  Since her last visit, the patient has done reasonably well.  She has no GI symptoms or any vaginal bleeding or pelvic pain.  She does have urinary frequency and dysuria.  These symptoms are the same as previously and at our last visit, we had checked a urine which showed no evidence of infection. Patient notes that she voids five to six times at night and very frequently during the day.  REVIEW OF SYSTEMS:  Review of systems reveals no cardiovascular, pulmonary or neurologic symptoms.  FAMILY HISTORY AND SOCIAL HISTORY:  Unchanged from previous notations.  PHYSICAL EXAMINATION  GENERAL:  Physical exam reveals a healthy black female in no acute distress.  HEENT:  Negative.  NECK:  Supple without thyromegaly.  LYMPHATICS:  There is no supraclavicular, axillary or inguinal adenopathy.  ABDOMEN:  Soft and nontender.  No mass, organomegaly, ascites or herniae are noted.  Her incision and suprapubic site are well-healed.  PELVIC:  EG/BUS normal.  Vagina is clean, well-supported.  Bimanual and rectovaginal exam reveal no mass, induration or nodularity.  Pap smears are obtained.  IMPRESSION:  Stage IB cervical cancer, status post radical hysterectomy with one positive pelvic node, status post radiation therapy, no evidence of recurrent  disease.  Pap smear were obtained.  Chronic urinary tract symptoms.  We had the patient void and then performed catheterization to measure residual urine; she had only a 20 cc residual.  The urine was clear.  There is some sensation to the nurse of a urethral stenosis.  Given her continuing problem with her bladder, I would like to refer her to a urologist for evaluation of possible urethral stenosis or chronic trigonitis. She will return to see me in six months for continuing gynecologic followup. DD:  07/18/00 TD:  07/19/00 Job: [id number]

## 2010-10-21 NOTE — Consult Note (Signed)
[facility]  Patient:    [name], [name] Visit Number: [id number] MRN: [id number]          Service Type: GON Location: GYN Attending Physician:  Jeannette Corpus Dictated by:   Rande Brunt. Clarke-Pearson, M.D. Proc. Date: 02/27/01 Admit Date:  02/27/2001 Discharge Date: 02/27/2001   CC:         Gwenevere Abbot, M.D.  Trevor Iha, M.D.  Bertram Millard. Dahlstedt, M.D.   Consultation Report  HISTORY OF PRESENT ILLNESS:  A 50 year old African-American female returns for continuing follow-up of a stage IB cervical carcinoma.  She underwent radical hysterectomy in May 2000 and was found to have metastasis in one of four right pelvic lymph nodes as well as deep stromal invasion.  She received postoperative radiation therapy with 4500 cGy completed in July 2000.  She did not wish to have concurrent chemotherapy.  Since her last visit the patient has done reasonably well from a gynecologic point of view.  She does note continued absence of libido despite the use of Estratest.  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  She has no GI, GU, neurologic, cardiovascular, or pulmonary symptoms.  FAMILY HISTORY/SOCIAL HISTORY:  Unchanged.  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  PHYSICAL EXAMINATION:  VITAL SIGNS:  Weight 226 pounds, blood pressure 110/80.  GENERAL:  The patient is a healthy black female in no acute distress.  HEENT:  Negative.  NECK:  Supple without thyromegaly.  LYMPHATIC:  There is no supraclavicular, axillary, or inguinal adenopathy.  ABDOMEN:  Soft, nontender, no mass, organomegaly, ascites, or hernias are noted.  Her midline incision is well-healed.  PELVIC:  EG, BUS normal.  Vagina is clean and well-supported.  No lesions are noted.  Bimanual and rectovaginal exam reveal no  masses, induration, or nodularity.  EXTREMITIES:  The lower extremities are without edema.  IMPRESSION:  Stage IB cervical cancer with positive lymph node.  No evidence of recurrent disease, now with over two years of follow-up following completion of adjuvant radiation therapy.  Decreased libido.  Given the fact that Estratest is not helping her, we will switch back to Premarin 0.9 mg daily.  She is given a new prescription for this.  The patient will return to see [place] in six months for continuing follow-up. Dictated by:   Rande Brunt. Clarke-Pearson, M.D. Attending Physician:  Jeannette Corpus DD:  03/05/01 TD:  03/05/01 Job: [id number]

## 2010-11-23 ENCOUNTER — Inpatient Hospital Stay (INDEPENDENT_AMBULATORY_CARE_PROVIDER_SITE_OTHER)
Admission: RE | Admit: 2010-11-23 | Discharge: 2010-11-23 | Disposition: A | Discharge status: Home / Self Care | Payer: Self-pay | Source: Ambulatory Visit | Attending: Emergency Medicine | Admitting: Emergency Medicine

## 2010-11-23 DIAGNOSIS — S058X9A Other injuries of unspecified eye and orbit, initial encounter: Secondary | ICD-10-CM

## 2012-03-27 ENCOUNTER — Encounter: Payer: Self-pay | Admitting: Family Medicine

## 2012-03-27 ENCOUNTER — Ambulatory Visit (INDEPENDENT_AMBULATORY_CARE_PROVIDER_SITE_OTHER): Payer: Self-pay | Admitting: Family Medicine

## 2012-03-27 VITALS — BP 161/109 | HR 94 | Ht 69.0 in | Wt 278.0 lb

## 2012-03-27 DIAGNOSIS — R350 Frequency of micturition: Secondary | ICD-10-CM

## 2012-03-27 DIAGNOSIS — I10 Essential (primary) hypertension: Secondary | ICD-10-CM

## 2012-03-27 LAB — POCT URINALYSIS DIPSTICK
Bilirubin, UA: NEGATIVE
Glucose, UA: NEGATIVE
Ketones, UA: NEGATIVE
Nitrite, UA: NEGATIVE
Protein, UA: NEGATIVE
Spec Grav, UA: 1.015
Urobilinogen, UA: 0.2
pH, UA: 6

## 2012-03-27 LAB — POCT UA - MICROSCOPIC ONLY

## 2012-03-27 LAB — GLUCOSE, CAPILLARY: Glucose-Capillary: 116 mg/dL — ABNORMAL HIGH (ref 70–99)

## 2012-03-27 MED ORDER — AMLODIPINE BESYLATE 5 MG PO TABS: 5.0000 mg | ORAL_TABLET | Freq: Every day | ORAL | Status: DC

## 2012-03-27 NOTE — Patient Instructions (Signed)
Your blood pressure is elevated, so I would like to start a medicine called amlodipine 5mg  daily. Please come back in 2 weeks to follow up on this.   For your bladder incontinence, like we talked about, decrease the amount of fluids you take after 6pm. Also you can try doing the exercises to help strengthen your pelvic muscles. I will call you if anything is abnormal from the urine.   For the lab work, please make an appointment for lab work 2 days before you plan on coming. Please come fasting (no food or drink after midnight) for the blood test.

## 2012-03-28 NOTE — Progress Notes (Signed)
Patient ID: [name], female   DOB: 14-Aug-1960, 51 y.o.   MRN: [id number]   Chief Complaint  Patient presents with  . NP-urinary frequency    x years     HPI [name] is a 51 y.o. female who presents for new patient visit.  Concerns are the following: - increased urinary frequency: x 2 years. She urinates 5-6 times overnight and similarly during the day. She reports feeling an urge to go and sometimes not making it to the toilet in time. She denies any loss of urine with coughing or laughing. She denies any dysuria or hematuria or abdominal pain. She denies any constipation.  - HTN: patient has never been diagnosed with hypertension. She denies any chest pain, any shortness of breath any lower extremity swelling, headache or weakness. Denies any family history of heart disease.   Past Medical History  Diagnosis Date  . Sickle cell trait   . Cervical cancer 2007    s/p partial hysterectomy and radiation    Past Surgical History  Procedure Date  . Abdominal hysterectomy 2007    for cervical cancer    Family History  Problem Relation Age of Onset  . Cancer Mother     breast cancer, died at 33yo    Social History History  Substance Use Topics  . Smoking status: Former Smoker    Quit date: 06/05/1996  . Smokeless tobacco: Not on file  . Alcohol Use: 1.8 oz/week    3 Cans of beer per week     per day    Allergies  Allergen Reactions  . Penicillins     Current Outpatient Prescriptions  Medication Sig Dispense Refill  . amLODipine (NORVASC) 5 MG tablet Take 1 tablet (5 mg total) by mouth daily.  90 tablet  3    Review of Systems Review of Systems  Constitutional: Negative for fever, chills, appetite change and fatigue.  HENT: Negative for congestion, rhinorrhea and neck pain.   Eyes: Negative.   Respiratory: Negative for cough, chest tightness, shortness of breath and wheezing.   Cardiovascular: Negative for chest pain, palpitations and leg swelling.    Gastrointestinal: Negative for nausea, abdominal pain, diarrhea, constipation, blood in stool and abdominal distention.  Genitourinary: Positive for urgency and frequency. Negative for hematuria, flank pain, vaginal bleeding, vaginal discharge, vaginal pain and pelvic pain.  Musculoskeletal: Negative for back pain and arthralgias.  Skin: Negative.   Neurological: Negative.   Hematological: Negative.   Psychiatric/Behavioral: Negative.     Blood pressure 161/109, pulse 94, height 5\' 9"  (1.753 m), weight 278 lb (126.1 kg).  Physical Exam Physical Exam  Constitutional: She is oriented to person, place, and time. She appears well-developed and well-nourished. No distress.  HENT:  Head: Normocephalic.  Mouth/Throat: Oropharynx is clear and moist.  Eyes: EOM are normal. Pupils are equal, round, and reactive to light.  Neck: Normal range of motion. Neck supple. No thyromegaly present.  Cardiovascular: Normal rate, regular rhythm and normal heart sounds.   Pulmonary/Chest: Effort normal and breath sounds normal. She has no wheezes.  Abdominal: Soft. She exhibits no distension and no mass. There is no tenderness.  Neurological: She is alert and oriented to person, place, and time. No cranial nerve deficit.  Skin: Skin is warm and dry. No rash noted.  Psychiatric: She has a normal mood and affect. Her behavior is normal.    Data Reviewed na  Assessment   see problem list    Plan  See problem list       Marena Chancy 03/31/2012, 1:53 PM

## 2012-03-31 ENCOUNTER — Encounter: Payer: Self-pay | Admitting: Family Medicine

## 2012-03-31 DIAGNOSIS — R350 Frequency of micturition: Secondary | ICD-10-CM | POA: Insufficient documentation

## 2012-03-31 DIAGNOSIS — I10 Essential (primary) hypertension: Secondary | ICD-10-CM | POA: Insufficient documentation

## 2012-03-31 NOTE — Assessment & Plan Note (Addendum)
Appears to be associated with component of urge incontinence. UA is unremarkable. Non fasting CBG is within normal limits at 116, making diabetes as reason for frequency less likely. Will likely need to perform pelvic exam to exclude any cystocele or rectocele given history of hysterectomy. Gave patient instructions of plevic strengthening exercises as well as reducing fluid, caffeine and alcohol at night time. PAtient doe report drinking 3 beers daily which could be contributing to her symptoms. No neuro findings on exam or history making neurogenic bladder less likely. Will continue to monitor and follow up in 2 weeks.

## 2012-03-31 NOTE — Assessment & Plan Note (Signed)
Patient with type 2 hypertension, not on any medication. Will initiate treatment today: amlodipine 5mg  daily. Patient to return to clinic for CMP, fasting lipid panel. Patient to return in 10-14 days for follow up

## 2012-04-01 ENCOUNTER — Other Ambulatory Visit: Payer: Self-pay | Admitting: Family Medicine

## 2012-04-01 DIAGNOSIS — Z1231 Encounter for screening mammogram for malignant neoplasm of breast: Secondary | ICD-10-CM

## 2012-04-05 ENCOUNTER — Other Ambulatory Visit: Payer: Self-pay

## 2012-04-05 DIAGNOSIS — I10 Essential (primary) hypertension: Secondary | ICD-10-CM

## 2012-04-05 DIAGNOSIS — R350 Frequency of micturition: Secondary | ICD-10-CM

## 2012-04-05 LAB — COMPLETE METABOLIC PANEL WITH GFR
ALT: 20 U/L (ref 0–35)
AST: 17 U/L (ref 0–37)
Albumin: 4 g/dL (ref 3.5–5.2)
Alkaline Phosphatase: 55 U/L (ref 39–117)
BUN: 12 mg/dL (ref 6–23)
CO2: 28 mEq/L (ref 19–32)
Calcium: 9.5 mg/dL (ref 8.4–10.5)
Chloride: 104 mEq/L (ref 96–112)
Creat: 0.73 mg/dL (ref 0.50–1.10)
GFR, Est African American: >89 mL/min
GFR, Est Non African American: >89 mL/min
Glucose, Bld: 119 mg/dL — ABNORMAL HIGH (ref 70–99)
Potassium: 4.3 mEq/L (ref 3.5–5.3)
Sodium: 142 mEq/L (ref 135–145)
Total Bilirubin: 0.3 mg/dL (ref 0.3–1.2)
Total Protein: 6.9 g/dL (ref 6.0–8.3)

## 2012-04-05 LAB — CBC WITH DIFFERENTIAL/PLATELET
Basophils Absolute: 0 10*3/uL (ref 0.0–0.1)
Basophils Relative: 1 % (ref 0–1)
Eosinophils Absolute: 0.1 10*3/uL (ref 0.0–0.7)
Eosinophils Relative: 1 % (ref 0–5)
HCT: 36.9 % (ref 36.0–46.0)
Hemoglobin: 12.8 g/dL (ref 12.0–15.0)
Lymphocytes Relative: 46 % (ref 12–46)
Lymphs Abs: 2 10*3/uL (ref 0.7–4.0)
MCH: 29.3 pg (ref 26.0–34.0)
MCHC: 34.7 g/dL (ref 30.0–36.0)
MCV: 84.4 fL (ref 78.0–100.0)
Monocytes Absolute: 0.4 10*3/uL (ref 0.1–1.0)
Monocytes Relative: 10 % (ref 3–12)
Neutro Abs: 1.8 10*3/uL (ref 1.7–7.7)
Neutrophils Relative %: 42 % — ABNORMAL LOW (ref 43–77)
Platelets: 265 10*3/uL (ref 150–400)
RBC: 4.37 MIL/uL (ref 3.87–5.11)
RDW: 14.3 % (ref 11.5–15.5)
WBC: 4.3 10*3/uL (ref 4.0–10.5)

## 2012-04-05 LAB — LIPID PANEL
Cholesterol: 209 mg/dL — ABNORMAL HIGH (ref 0–200)
HDL: 62 mg/dL (ref >39)
LDL Cholesterol: 129 mg/dL — ABNORMAL HIGH (ref 0–99)
Total CHOL/HDL Ratio: 3.4 Ratio
Triglycerides: 92 mg/dL (ref <150)
VLDL: 18 mg/dL (ref 0–40)

## 2012-04-05 NOTE — Progress Notes (Signed)
CMP,CBC WITH DIFF AND FLP DONE TODAY [name] 

## 2012-04-09 ENCOUNTER — Encounter: Payer: Self-pay | Admitting: Family Medicine

## 2012-04-09 ENCOUNTER — Ambulatory Visit (INDEPENDENT_AMBULATORY_CARE_PROVIDER_SITE_OTHER): Payer: Self-pay | Admitting: Family Medicine

## 2012-04-09 VITALS — BP 139/93 | HR 92 | Ht 69.0 in | Wt 277.0 lb

## 2012-04-09 DIAGNOSIS — E8881 Metabolic syndrome: Secondary | ICD-10-CM

## 2012-04-09 DIAGNOSIS — R7309 Other abnormal glucose: Secondary | ICD-10-CM

## 2012-04-09 DIAGNOSIS — M25519 Pain in unspecified shoulder: Secondary | ICD-10-CM

## 2012-04-09 DIAGNOSIS — I10 Essential (primary) hypertension: Secondary | ICD-10-CM

## 2012-04-09 DIAGNOSIS — R7303 Prediabetes: Secondary | ICD-10-CM

## 2012-04-09 DIAGNOSIS — E669 Obesity, unspecified: Secondary | ICD-10-CM

## 2012-04-09 DIAGNOSIS — M25512 Pain in left shoulder: Secondary | ICD-10-CM

## 2012-04-09 MED ORDER — PRAVASTATIN SODIUM 40 MG PO TABS: 40.0000 mg | ORAL_TABLET | Freq: Every day | ORAL | Status: DC

## 2012-04-09 MED ORDER — AMLODIPINE BESYLATE 10 MG PO TABS: 10.0000 mg | ORAL_TABLET | Freq: Every day | ORAL | Status: DC

## 2012-04-09 NOTE — Assessment & Plan Note (Signed)
Possible tendinopathy. No evidence of frozen shoulder, rotator cuff tear. Cervical etiology is still part of the differential despite normal range of movement of neck.  Will treat symptomatically with NSAIDs and topical agents (see AVS). Gave shoulder strengthening exercises. Will follow up on symptoms at next visit. Hold on further imaging for now.

## 2012-04-09 NOTE — Assessment & Plan Note (Signed)
Responded well to amlodipine 5mg . Still elevated. Will therefore increase to 10mg  daily. BMP within normal limits electrolytes wise.

## 2012-04-09 NOTE — Assessment & Plan Note (Signed)
Fasting BMP with CBG at 119. Concern for pre-daibetes. Counseled on decreasing amount of sodas and sweet drinks as first step in management.  LDL at 129. Given risk factors, will treat with pravastatin 40mg  daily.

## 2012-04-09 NOTE — Progress Notes (Signed)
Patient ID: [name]    DOB: [birth year], 51 y.o.   MRN: [id number] --- Subjective:  [name] is a 51 y.o.female who presents for follow up on labs, blood pressure as well as left shoulder pain. - hypertension: started taking amlodipine 5mg  daily. No chest pain, no shortness of breath, no lower extremity swelling.  - left shoulder pain: x 8 months, not worsening or improving. Worst with lifting shoulder,better with rest. Pain with lifting arm and reaching backwards. Recalls falling along her upper body a few months ago. No weakness in arm. No tingling. Takes ibuprofen 800mg  when it occurs which only helps mildly.    ROS: see HPI Past Medical History: reviewed and updated medications and allergies. Social History: Tobacco: former smoker  Objective: Filed Vitals:   04/09/12 1025  BP: 139/93  Pulse: 92    Physical Examination:   General appearance - alert, well appearing, and in no distress Chest - clear to auscultation, no wheezes, rales or rhonchi, symmetric air entry Heart - normal rate, regular rhythm, normal S1, S2, no murmurs, rubs, clicks or gallops Extremities - peripheral pulses normal, no pedal edema Left shoulder - no tenderness along glenohumoral joint, normal abduction and raising of arms above head, normal internal and external rotation. Normal range of motion of neck.

## 2012-04-09 NOTE — Patient Instructions (Addendum)
  As we talked about for the shoulder pain, you can take 600mg  ibuorpfen every 4-6hrs as needed for pain when it happens. You can also use aspercreme or capsaicin to help locally.

## 2012-04-19 ENCOUNTER — Ambulatory Visit (HOSPITAL_COMMUNITY)
Admission: RE | Admit: 2012-04-19 | Discharge: 2012-04-19 | Disposition: A | Discharge status: Home / Self Care | Payer: Self-pay | Source: Ambulatory Visit | Attending: Family Medicine | Admitting: Family Medicine

## 2012-04-19 DIAGNOSIS — Z1231 Encounter for screening mammogram for malignant neoplasm of breast: Secondary | ICD-10-CM

## 2012-12-21 ENCOUNTER — Encounter (HOSPITAL_COMMUNITY): Payer: Self-pay | Admitting: Emergency Medicine

## 2012-12-21 ENCOUNTER — Emergency Department (INDEPENDENT_AMBULATORY_CARE_PROVIDER_SITE_OTHER)
Admission: EM | Admit: 2012-12-21 | Discharge: 2012-12-21 | Disposition: A | Discharge status: Home / Self Care | Payer: No Typology Code available for payment source | Source: Home / Self Care | Attending: Emergency Medicine | Admitting: Emergency Medicine

## 2012-12-21 ENCOUNTER — Emergency Department (INDEPENDENT_AMBULATORY_CARE_PROVIDER_SITE_OTHER): Payer: No Typology Code available for payment source

## 2012-12-21 DIAGNOSIS — M948X9 Other specified disorders of cartilage, unspecified sites: Secondary | ICD-10-CM

## 2012-12-21 DIAGNOSIS — M2580 Other specified joint disorders, unspecified joint: Secondary | ICD-10-CM

## 2012-12-21 MED ORDER — DICLOFENAC SODIUM 1 % TD GEL: 2.0000 g | Freq: Four times a day (QID) | TRANSDERMAL | Status: DC

## 2012-12-21 NOTE — ED Provider Notes (Signed)
   History    CSN: [id number] Arrival date & time 12/21/12  1106  First MD Initiated Contact with Patient 12/21/12 1226     Chief Complaint  Patient presents with  . Foot Pain   (Consider location/radiation/quality/duration/timing/severity/associated sxs/prior Treatment) Patient is a 52 y.o. female presenting with lower extremity pain. The history is provided by the patient.  Foot Pain This is a new problem. The current episode started more than 1 week ago. The problem has been gradually worsening.   Past Medical History  Diagnosis Date  . Sickle cell trait   . Cervical cancer 2007    s/p partial hysterectomy and radiation   Past Surgical History  Procedure Laterality Date  . Abdominal hysterectomy  2007    for cervical cancer  . Joint replacement      left hip replacement 3-5 years ago   Family History  Problem Relation Age of Onset  . Cancer Mother     breast cancer, died at 52yo   History  Substance Use Topics  . Smoking status: Former Smoker    Quit date: 06/05/1996  . Smokeless tobacco: Not on file  . Alcohol Use: 1.8 oz/week    3 Cans of beer per week     Comment: per day   OB History   Grav Para Term Preterm Abortions TAB SAB Ect Mult Living                 Review of Systems  Constitutional: Negative.   Musculoskeletal: Positive for joint swelling.  Skin: Negative.     Allergies  Penicillins  Home Medications   Current Outpatient Rx  Name  Route  Sig  Dispense  Refill  . amLODipine (NORVASC) 10 MG tablet   Oral   Take 1 tablet (10 mg total) by mouth daily.   30 tablet   3   . diclofenac sodium (VOLTAREN) 1 % GEL   Topical   Apply 2 g topically 4 (four) times daily.   100 g   1   . pravastatin (PRAVACHOL) 40 MG tablet   Oral   Take 1 tablet (40 mg total) by mouth daily.   30 tablet   3    BP 123/81  Pulse 95  Temp(Src) 98.6 F (37 C) (Oral)  Resp 16  SpO2 99% Physical Exam  Constitutional: She is oriented to person, place,  and time.  Musculoskeletal: She exhibits tenderness.       Feet:  Neurological: She is alert and oriented to person, place, and time.  Skin: Skin is warm and dry.    ED Course  Procedures (including critical care time) Labs Reviewed - No data to display Dg Foot Complete Left  12/21/2012   *RADIOLOGY REPORT*  Clinical Data: Pain bottom of the foot at the first metatarsal phalangeal joint for 1 week with no known injury  LEFT FOOT - COMPLETE 3+ VIEW  Comparison: 09/16/2007  Findings: No fracture or dislocation.  No significant degenerative change.  No periosteal reaction. There is a small to moderate heel spur.  IMPRESSION: No significant abnormalities   Original Report Authenticated By: Esperanza Heir, M.D.   1. Sesamoiditis     MDM    Linna Hoff, MD 12/21/12 [phone]

## 2012-12-21 NOTE — ED Notes (Addendum)
Pt c/o pain on bottom of left foot/toe onset x1 week... sxs include swelling and pain that increases when she walks/pressure... Denies inj/trauma... She is alert w/no signs of acute distress.

## 2013-02-13 ENCOUNTER — Ambulatory Visit: Payer: No Typology Code available for payment source

## 2013-05-22 ENCOUNTER — Other Ambulatory Visit: Payer: Self-pay | Admitting: Family Medicine

## 2013-05-22 DIAGNOSIS — Z1231 Encounter for screening mammogram for malignant neoplasm of breast: Secondary | ICD-10-CM

## 2013-06-10 ENCOUNTER — Ambulatory Visit (HOSPITAL_COMMUNITY)
Admission: RE | Admit: 2013-06-10 | Discharge: 2013-06-10 | Disposition: A | Discharge status: Home / Self Care | Payer: No Typology Code available for payment source | Source: Ambulatory Visit | Attending: Family Medicine | Admitting: Family Medicine

## 2013-06-10 DIAGNOSIS — Z1231 Encounter for screening mammogram for malignant neoplasm of breast: Secondary | ICD-10-CM | POA: Insufficient documentation

## 2013-08-25 ENCOUNTER — Other Ambulatory Visit: Payer: Self-pay | Admitting: Family Medicine

## 2013-08-25 ENCOUNTER — Other Ambulatory Visit: Payer: Self-pay | Admitting: *Deleted

## 2013-08-25 MED ORDER — AMLODIPINE BESYLATE 10 MG PO TABS: 10.0000 mg | ORAL_TABLET | Freq: Every day | ORAL | Status: DC

## 2014-04-13 ENCOUNTER — Other Ambulatory Visit: Payer: Self-pay | Admitting: *Deleted

## 2014-04-13 MED ORDER — AMLODIPINE BESYLATE 10 MG PO TABS: 10.0000 mg | ORAL_TABLET | Freq: Every day | ORAL | Status: DC

## 2014-04-13 NOTE — Telephone Encounter (Signed)
Amlodipine prescription refilled with no refills. Please have the patient come into the clinic, as she has not been seen in 2013.   Thanks, Archie Patten, MD [facility] Family Medicine Resident  04/13/2014, 5:46 PM

## 2014-04-14 ENCOUNTER — Telehealth: Payer: Self-pay | Admitting: Family Medicine

## 2014-04-14 NOTE — Telephone Encounter (Signed)
Has appt dec 9.  Needs blood pressure pills to last until then

## 2014-04-14 NOTE — Telephone Encounter (Signed)
Left message on patient voicemail informing of message from MD.

## 2014-04-15 NOTE — Telephone Encounter (Signed)
Patient informed that rx was at pharmacy.

## 2014-05-07 ENCOUNTER — Ambulatory Visit: Payer: Self-pay | Admitting: Family Medicine

## 2014-05-11 ENCOUNTER — Other Ambulatory Visit: Payer: Self-pay | Admitting: Family Medicine

## 2014-05-11 DIAGNOSIS — Z1231 Encounter for screening mammogram for malignant neoplasm of breast: Secondary | ICD-10-CM

## 2014-05-18 ENCOUNTER — Encounter: Payer: Self-pay | Admitting: Family Medicine

## 2014-05-18 ENCOUNTER — Ambulatory Visit (INDEPENDENT_AMBULATORY_CARE_PROVIDER_SITE_OTHER): Payer: BC Managed Care – HMO | Admitting: Family Medicine

## 2014-05-18 VITALS — BP 152/94 | HR 103 | Temp 98.6°F | Ht 69.0 in | Wt 282.0 lb

## 2014-05-18 DIAGNOSIS — E8881 Metabolic syndrome: Secondary | ICD-10-CM

## 2014-05-18 DIAGNOSIS — R350 Frequency of micturition: Secondary | ICD-10-CM | POA: Diagnosis not present

## 2014-05-18 DIAGNOSIS — I10 Essential (primary) hypertension: Secondary | ICD-10-CM | POA: Diagnosis not present

## 2014-05-18 DIAGNOSIS — E119 Type 2 diabetes mellitus without complications: Secondary | ICD-10-CM | POA: Insufficient documentation

## 2014-05-18 DIAGNOSIS — C539 Malignant neoplasm of cervix uteri, unspecified: Secondary | ICD-10-CM | POA: Diagnosis not present

## 2014-05-18 DIAGNOSIS — R7309 Other abnormal glucose: Secondary | ICD-10-CM | POA: Diagnosis not present

## 2014-05-18 DIAGNOSIS — R7303 Prediabetes: Secondary | ICD-10-CM

## 2014-05-18 LAB — POCT UA - MICROSCOPIC ONLY

## 2014-05-18 LAB — POCT URINALYSIS DIPSTICK
BILIRUBIN UA: NEGATIVE
GLUCOSE UA: NEGATIVE
KETONES UA: NEGATIVE
Leukocytes, UA: NEGATIVE
Nitrite, UA: NEGATIVE
Protein, UA: NEGATIVE
SPEC GRAV UA: 1.01
Urobilinogen, UA: 0.2
pH, UA: 6

## 2014-05-18 LAB — POCT GLYCOSYLATED HEMOGLOBIN (HGB A1C): Hemoglobin A1C: 6.4

## 2014-05-18 MED ORDER — LISINOPRIL 10 MG PO TABS: 10.0000 mg | ORAL_TABLET | Freq: Every day | ORAL | Status: DC

## 2014-05-18 MED ORDER — METFORMIN HCL 500 MG PO TABS: 500.0000 mg | ORAL_TABLET | Freq: Two times a day (BID) | ORAL | Status: DC

## 2014-05-18 MED ORDER — AMLODIPINE BESYLATE 10 MG PO TABS: 10.0000 mg | ORAL_TABLET | Freq: Every day | ORAL | Status: DC

## 2014-05-18 MED ORDER — PRAVASTATIN SODIUM 40 MG PO TABS: 40.0000 mg | ORAL_TABLET | Freq: Every day | ORAL | Status: DC

## 2014-05-18 NOTE — Patient Instructions (Addendum)
It was nice to meet you. I am a little concerned that you have not had a pap smear in several years and have put in referral for you to see the gynecologist oncologist (I DO NOT think that have cancer, but would like for them to evaluate you and give recommendations on how often you should have pap smears). Our office will contact them and they will contact you. Please come back in 2-3 weeks to re-check your urine and electrolytes (blood work)  Metformin tablets What is this medicine? METFORMIN (met FOR min) is used to treat type 2 diabetes. It helps to control blood sugar. Treatment is combined with diet and exercise. This medicine can be used alone or with other medicines for diabetes. This medicine may be used for other purposes; ask your health care provider or pharmacist if you have questions. COMMON BRAND NAME(S): Glucophage What should I tell my health care provider before I take this medicine? They need to know if you have any of these conditions: -anemia -frequently drink alcohol-containing beverages -become easily dehydrated -heart attack -heart failure that is treated with medications -kidney disease -liver disease -polycystic ovary syndrome -serious infection or injury -vomiting -an unusual or allergic reaction to metformin, other medicines, foods, dyes, or preservatives -pregnant or trying to get pregnant -breast-feeding How should I use this medicine? Take this medicine by mouth. Take it with meals. Swallow the tablets with a drink of water. Follow the directions on the prescription label. Take your medicine at regular intervals. Do not take your medicine more often than directed. Talk to your pediatrician regarding the use of this medicine in children. While this drug may be prescribed for children as young as 54 years of age for selected conditions, precautions do apply. Overdosage: If you think you have taken too much of this medicine contact a poison control center or  emergency room at once. NOTE: This medicine is only for you. Do not share this medicine with others. What if I miss a dose? If you miss a dose, take it as soon as you can. If it is almost time for your next dose, take only that dose. Do not take double or extra doses. What may interact with this medicine? Do not take this medicine with any of the following medications: -dofetilide -gatifloxacin -certain contrast medicines given before X-rays, CT scans, MRI, or other procedures This medicine may also interact with the following medications: -digoxin -diuretics -female hormones, like estrogens or progestins and birth control pills -isoniazid -medicines for blood pressure, heart disease, irregular heart beat -morphine -nicotinic acid -phenothiazines like chlorpromazine, mesoridazine, prochlorperazine, thioridazine -phenytoin -procainamide -quinidine -quinine -ranitidine -steroid medicines like prednisone or cortisone -stimulant medicines for attention disorders, weight loss, or to stay awake -thyroid medicines -trimethoprim -vancomycin This list may not describe all possible interactions. Give your health care provider a list of all the medicines, herbs, non-prescription drugs, or dietary supplements you use. Also tell them if you smoke, drink alcohol, or use illegal drugs. Some items may interact with your medicine. What should I watch for while using this medicine? Visit your doctor or health care professional for regular checks on your progress. A test called the HbA1C (A1C) will be monitored. This is a simple blood test. It measures your blood sugar control over the last 2 to 3 months. You will receive this test every 3 to 6 months. Learn how to check your blood sugar. Learn the symptoms of low and high blood sugar and how to manage them.  Always carry a quick-source of sugar with you in case you have symptoms of low blood sugar. Examples include hard sugar candy or glucose tablets.  Make sure others know that you can choke if you eat or drink when you develop serious symptoms of low blood sugar, such as seizures or unconsciousness. They must get medical help at once. Tell your doctor or health care professional if you have high blood sugar. You might need to change the dose of your medicine. If you are sick or exercising more than usual, you might need to change the dose of your medicine. Do not skip meals. Ask your doctor or health care professional if you should avoid alcohol. Many nonprescription cough and cold products contain sugar or alcohol. These can affect blood sugar. This medicine may cause ovulation in premenopausal women who do not have regular monthly periods. This may increase your chances of becoming pregnant. You should not take this medicine if you become pregnant or think you may be pregnant. Talk with your doctor or health care professional about your birth control options while taking this medicine. Contact your doctor or health care professional right away if think you are pregnant. If you are going to need surgery, a MRI, CT scan, or other procedure, tell your doctor that you are taking this medicine. You may need to stop taking this medicine before the procedure. Wear a medical ID bracelet or chain, and carry a card that describes your disease and details of your medicine and dosage times. What side effects may I notice from receiving this medicine? Side effects that you should report to your doctor or health care professional as soon as possible: -allergic reactions like skin rash, itching or hives, swelling of the face, lips, or tongue -breathing problems -feeling faint or lightheaded, falls -muscle aches or pains -signs and symptoms of low blood sugar such as feeling anxious, confusion, dizziness, increased hunger, unusually weak or tired, sweating, shakiness, cold, irritable, headache, blurred vision, fast heartbeat, loss of consciousness -slow or  irregular heartbeat -unusual stomach pain or discomfort -unusually tired or weak Side effects that usually do not require medical attention (report to your doctor or health care professional if they continue or are bothersome): -diarrhea -headache -heartburn -metallic taste in mouth -nausea -stomach gas, upset This list may not describe all possible side effects. Call your doctor for medical advice about side effects. You may report side effects to FDA at 1-800-FDA-1088. Where should I keep my medicine? Keep out of the reach of children. Store at room temperature between 15 and 30 degrees C (59 and 86 degrees F). Protect from moisture and light. Throw away any unused medicine after the expiration date. NOTE: This sheet is a summary. It may not cover all possible information. If you have questions about this medicine, talk to your doctor, pharmacist, or health care provider.  2015, [employer]. (2012-09-03 16:03:44)  

## 2014-05-18 NOTE — Progress Notes (Signed)
Family Medicine  Archie Patten, MD  Subjective:  Chief complaint: refill on medications  Ms. [name] is a 53 year old female with past medical history of cervical cancer status post radical hysterectomy and adjuvant radiation due to positive lymph nodes, hypertension, and several years worth of urinary frequency presenting for a checkup and requesting diet pills  Hypertension The patient states that she is currently maintained on amlodipine 10 mg daily. She denies any missed doses, stating that she has been getting refills without coming into clinic. She denies any chest pain, shortness of breath, swelling, orthostatic hypotension.   Obesity The patient request diet pills. She tells me that she has been walking. 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 She is not interested in discussing nutrition or ways to improve her diet. She states that she "eats everything" and does not wish to change her ways. She would like something to help her decrease her appetite so she just eats less of all of her current foods.  Urinary frequency The patient states that she urinates often, having to wake up 5-6 times per night. She also endorses urinary incontinence when she cannot get them bathroom on time. She denies any urine leakage with cough or laughing. We discussed the possibility that her blood sugar could be the etiology and she becomes frustrated, as this was discussed in 2013 and at that time her blood sugars were okay and no other workup was done. She denies any dysuria or hematuria. She also denies any increased thirst.  History of cervical cancer status post hysterectomy: Per the EMR, the patient had stage IB cervical cancer and is status post radical hysterectomy and she hasn't radiation therapy for positive pelvic nodes, completed in July 2000.  The  patient last saw gynecology oncology in 2004, at which time Pap smears were obtained and she was advised to return in one year for continued surveillance, however there are no results from that time and she has not returned.   Past Medical History Patient Active Problem List   Diagnosis Date Noted  . Prediabetes 05/18/2014  . Left shoulder pain 04/09/2012  . Metabolic syndrome 83/66/2947  . Hypertension 03/31/2012  . Urinary frequency 03/31/2012  . Malignant neoplasm of cervix uteri 08/02/2006  . HIP PAINFUL, ARTHRALGIA 08/02/2006    Medications- reviewed and updated Current Outpatient Prescriptions  Medication Sig Dispense Refill  . amLODipine (NORVASC) 10 MG tablet Take 1 tablet (10 mg total) by mouth daily. 30 tablet 11  . lisinopril (PRINIVIL,ZESTRIL) 10 MG tablet Take 1 tablet (10 mg total) by mouth daily. 30 tablet 3  . metFORMIN (GLUCOPHAGE) 500 MG tablet Take 1 tablet (500 mg total) by mouth 2 (two) times daily with a meal. 60 tablet 3  . pravastatin (PRAVACHOL) 40 MG tablet Take 1 tablet (40 mg total) by mouth daily. 30 tablet 11  . diclofenac sodium (VOLTAREN) 1 % GEL Apply 2 g topically 4 (four) times daily. (Patient not taking: Reported on 05/18/2014) 100 g 1   No current facility-administered medications for this visit.    Objective: BP 152/94 mmHg  Pulse 103  Temp(Src) 98.6 F (37 C) (Oral)  Ht 5\' 9"  (1.753 m)  Wt 282 lb (127.914 kg)  BMI 41.63 kg/m2  Repeat BP 150/90 Gen: No acute distress. Alert HEENT: Atraumatic, EOMI, PERRLA, Oropharynx clear. MMM CV: RRR. No murmurs,  rubs, or gallops noted. 2+ radial pulses bilaterally. Resp: CTAB. No wheezing, crackles, or rhonchi noted. Abd: +BS. Soft, non-distended, non-tender. No rebound or guarding.  Ext: No edema. No gross deformities. Neuro: Alert and oriented, No gross focal deficits  Urinalysis: Negative for nitrites or leukocyte esterase, small RBCs, 1-5 epithelial cells, 0-2 WBC, 2+ bacteria, 0-2  RBCs  Assessment/Plan:  Hypertension Patient with elevated blood pressures today. Given she has prediabetes, will start lisinopril 10 mg for renal protection -Will get a Bmet today -Patient to return in 2 weeks after starting lisinopril for a BMET  Prediabetes Hemoglobin A1c 6.4 today in clinic. The patient was amenable to starting metformin 500 mg twice a day. If she can tolerate this without GI upset, would consider increasing to 1000 mg twice a day. Discussed the risk and benefits, specifically the benefit of improvement in hyperglycemia and weight loss and the risk of hypoglycemia and the need to monitor renal function. -Will assess renal function in 2 weeks -Repeat A1c in 3 months  Malignant neoplasm of cervix uteri The patient does not recall going to see comment ecology oncology since her diagnosis of cervical cancer in her hysterectomy. The last note seen in the EMR was from 2004 (4 years after her hysterectomy).  There is a Pap smear from 2006 which was negative for dysplasia or malignancy. -Will refer patient back to gynecology oncology for evaluation and recommendations on monitoring  Urinary frequency The patient endorses urinary frequency with occasional urinary incontinence for several years now. Given the patient is prediabetic, improvement in her hyperglycemia may cause an improvement in her urinary frequency, however I am unsure that this is the etiology.  They were a few RBCs on urinalysis, however unsure if this was secondary to a dirty catch as there were epithelial cells as well. -Repeat urinalysis in 2-3 weeks: if she continues to have red blood cells in her urine consider evaluation for hematuria with a CT of the abdomen and pelvis with and without contrast and if the etiology is not revealed, a referral to urology for cystoscopy. -If the patient's urinary frequency does not improve with improvement in blood glucose, will need to perform a pelvic exam to exclude a  cystocele or rectocele given the patient's history of hysterectomy. -The patient's symptoms seem to be more urge incontinence as opposed to stress incontinence: Consider oxybutynin -Continue to stress the importance of reducing fluid intake, alcohol intake, and caffeine intake at night.  Metabolic syndrome Patient is currently maintained on pravastatin 40 mg daily without side effects. Discussed diet pills with the patient and stated that I would not prescribe these without evidence of her attempting to make lifestyle modifications. The patient endorsed frustration, however foist understanding. She was happy to hear that metformin could help with weight loss. -Refill pravastatin -Check a lipid panel today    Orders Placed This Encounter  Procedures  . Comprehensive metabolic panel  . CBC  . Lipid panel  . Basic Metabolic Panel    Standing Status: Future     Number of Occurrences:      Standing Expiration Date: 05/21/2015  . Ambulatory referral to Gynecologic Oncology    Referral Priority:  Routine    Referral Type:  Consultation    Referral Reason:  Specialty Services Required    Requested Specialty:  Oncology    Number of Visits Requested:  1  . POCT A1C  . POCT Urinalysis dipstick  . POCT UA - Microscopic Only  . POCT Urinalysis dipstick  Future order (05/2014-06/2014)    Meds ordered this encounter  Medications  . DISCONTD: metFORMIN (GLUCOPHAGE) 500 MG tablet    Sig: Take 1 tablet (500 mg total) by mouth 2 (two) times daily with a meal.    Dispense:  60 tablet    Refill:  3  . DISCONTD: lisinopril (PRINIVIL,ZESTRIL) 10 MG tablet    Sig: Take 1 tablet (10 mg total) by mouth daily.    Dispense:  30 tablet    Refill:  3  . amLODipine (NORVASC) 10 MG tablet    Sig: Take 1 tablet (10 mg total) by mouth daily.    Dispense:  30 tablet    Refill:  11  . pravastatin (PRAVACHOL) 40 MG tablet    Sig: Take 1 tablet (40 mg total) by mouth daily.    Dispense:  30 tablet     Refill:  11  . lisinopril (PRINIVIL,ZESTRIL) 10 MG tablet    Sig: Take 1 tablet (10 mg total) by mouth daily.    Dispense:  30 tablet    Refill:  3  . metFORMIN (GLUCOPHAGE) 500 MG tablet    Sig: Take 1 tablet (500 mg total) by mouth 2 (two) times daily with a meal.    Dispense:  60 tablet    Refill:  3

## 2014-05-19 LAB — CBC
HCT: 35.4 % — ABNORMAL LOW (ref 36.0–46.0)
Hemoglobin: 12.2 g/dL (ref 12.0–15.0)
MCH: 29.5 pg (ref 26.0–34.0)
MCHC: 34.5 g/dL (ref 30.0–36.0)
MCV: 85.5 fL (ref 78.0–100.0)
MPV: 10.2 fL (ref 9.4–12.4)
PLATELETS: 261 10*3/uL (ref 150–400)
RBC: 4.14 MIL/uL (ref 3.87–5.11)
RDW: 14.8 % (ref 11.5–15.5)
WBC: 5.5 10*3/uL (ref 4.0–10.5)

## 2014-05-19 LAB — COMPREHENSIVE METABOLIC PANEL
ALBUMIN: 4.1 g/dL (ref 3.5–5.2)
ALT: 25 U/L (ref 0–35)
AST: 20 U/L (ref 0–37)
Alkaline Phosphatase: 65 U/L (ref 39–117)
BILIRUBIN TOTAL: 0.2 mg/dL (ref 0.2–1.2)
BUN: 19 mg/dL (ref 6–23)
CALCIUM: 10.3 mg/dL (ref 8.4–10.5)
CHLORIDE: 104 meq/L (ref 96–112)
CO2: 25 meq/L (ref 19–32)
Creat: 0.82 mg/dL (ref 0.50–1.10)
GLUCOSE: 124 mg/dL — AB (ref 70–99)
Potassium: 3.9 mEq/L (ref 3.5–5.3)
SODIUM: 141 meq/L (ref 135–145)
TOTAL PROTEIN: 7.2 g/dL (ref 6.0–8.3)

## 2014-05-19 LAB — LIPID PANEL
CHOLESTEROL: 184 mg/dL (ref 0–200)
HDL: 68 mg/dL (ref >39)
LDL Cholesterol: 63 mg/dL (ref 0–99)
TRIGLYCERIDES: 267 mg/dL — AB (ref <150)
Total CHOL/HDL Ratio: 2.7 Ratio
VLDL: 53 mg/dL — ABNORMAL HIGH (ref 0–40)

## 2014-05-20 ENCOUNTER — Encounter: Payer: Self-pay | Admitting: Family Medicine

## 2014-05-20 NOTE — Assessment & Plan Note (Signed)
The patient endorses urinary frequency with occasional urinary incontinence for several years now. Given the patient is prediabetic, improvement in her hyperglycemia may cause an improvement in her urinary frequency, however I am unsure that this is the etiology.  They were a few RBCs on urinalysis, however unsure if this was secondary to a dirty catch as there were epithelial cells as well. -Repeat urinalysis in 2-3 weeks: if she continues to have red blood cells in her urine consider evaluation for hematuria with a CT of the abdomen and pelvis with and without contrast and if the etiology is not revealed, a referral to urology for cystoscopy. -If the patient's urinary frequency does not improve with improvement in blood glucose, will need to perform a pelvic exam to exclude a cystocele or rectocele given the patient's history of hysterectomy. -The patient's symptoms seem to be more urge incontinence as opposed to stress incontinence: Consider oxybutynin -Continue to stress the importance of reducing fluid intake, alcohol intake, and caffeine intake at night.

## 2014-05-20 NOTE — Assessment & Plan Note (Signed)
The patient does not recall going to see [facility] oncology since her diagnosis of cervical cancer in her hysterectomy. The last note seen in the EMR was from 2004 (4 years after her hysterectomy).  There is a Pap smear from 2006 which was negative for dysplasia or malignancy. -Will refer patient back to gynecology oncology for evaluation and recommendations on monitoring

## 2014-05-20 NOTE — Assessment & Plan Note (Addendum)
Patient is currently maintained on pravastatin 40 mg daily without side effects. Discussed diet pills with the patient and stated that I would not prescribe these without evidence of her attempting to make lifestyle modifications. The patient endorsed frustration, however foist understanding. She was happy to hear that metformin could help with weight loss. -Refill pravastatin -Check a lipid panel today

## 2014-05-20 NOTE — Assessment & Plan Note (Addendum)
Patient with elevated blood pressures today. Given she has prediabetes, will start lisinopril 10 mg for renal protection -Will get a Bmet today -Patient to return in 2 weeks after starting lisinopril for a BMET

## 2014-05-20 NOTE — Assessment & Plan Note (Signed)
Hemoglobin A1c 6.4 today in clinic. The patient was amenable to starting metformin 500 mg twice a day. If she can tolerate this without GI upset, would consider increasing to 1000 mg twice a day. Discussed the risk and benefits, specifically the benefit of improvement in hyperglycemia and weight loss and the risk of hypoglycemia and the need to monitor renal function. -Will assess renal function in 2 weeks -Repeat A1c in 3 months

## 2014-06-08 ENCOUNTER — Other Ambulatory Visit: Payer: BLUE CROSS/BLUE SHIELD

## 2014-06-08 DIAGNOSIS — I10 Essential (primary) hypertension: Secondary | ICD-10-CM

## 2014-06-08 LAB — BASIC METABOLIC PANEL
BUN: 16 mg/dL (ref 6–23)
CALCIUM: 9.6 mg/dL (ref 8.4–10.5)
CO2: 28 meq/L (ref 19–32)
Chloride: 103 mEq/L (ref 96–112)
Creat: 1.03 mg/dL (ref 0.50–1.10)
GLUCOSE: 126 mg/dL — AB (ref 70–99)
POTASSIUM: 3.9 meq/L (ref 3.5–5.3)
SODIUM: 141 meq/L (ref 135–145)

## 2014-06-08 NOTE — Progress Notes (Signed)
BMP DONE TODAY Chee Dimon 

## 2014-06-16 ENCOUNTER — Ambulatory Visit (HOSPITAL_COMMUNITY)
Admission: RE | Admit: 2014-06-16 | Discharge: 2014-06-16 | Disposition: A | Discharge status: Home / Self Care | Payer: BLUE CROSS/BLUE SHIELD | Source: Ambulatory Visit | Attending: Family Medicine | Admitting: Family Medicine

## 2014-06-16 DIAGNOSIS — Z1231 Encounter for screening mammogram for malignant neoplasm of breast: Secondary | ICD-10-CM | POA: Diagnosis not present

## 2014-06-22 ENCOUNTER — Encounter: Payer: Self-pay | Admitting: Gynecologic Oncology

## 2014-06-22 ENCOUNTER — Ambulatory Visit: Payer: BLUE CROSS/BLUE SHIELD | Attending: Gynecologic Oncology | Admitting: Gynecologic Oncology

## 2014-06-22 ENCOUNTER — Other Ambulatory Visit (HOSPITAL_COMMUNITY)
Admission: RE | Admit: 2014-06-22 | Discharge: 2014-06-22 | Disposition: A | Discharge status: Home / Self Care | Payer: BLUE CROSS/BLUE SHIELD | Source: Ambulatory Visit | Attending: Gynecologic Oncology | Admitting: Gynecologic Oncology

## 2014-06-22 VITALS — BP 131/84 | HR 105 | Temp 98.7°F | Resp 18 | Ht 67.0 in | Wt 277.1 lb

## 2014-06-22 DIAGNOSIS — Z87891 Personal history of nicotine dependence: Secondary | ICD-10-CM | POA: Diagnosis not present

## 2014-06-22 DIAGNOSIS — Z08 Encounter for follow-up examination after completed treatment for malignant neoplasm: Secondary | ICD-10-CM | POA: Insufficient documentation

## 2014-06-22 DIAGNOSIS — Z01419 Encounter for gynecological examination (general) (routine) without abnormal findings: Secondary | ICD-10-CM | POA: Insufficient documentation

## 2014-06-22 DIAGNOSIS — R312 Other microscopic hematuria: Secondary | ICD-10-CM | POA: Diagnosis not present

## 2014-06-22 DIAGNOSIS — R3129 Other microscopic hematuria: Secondary | ICD-10-CM | POA: Insufficient documentation

## 2014-06-22 DIAGNOSIS — Z923 Personal history of irradiation: Secondary | ICD-10-CM | POA: Diagnosis not present

## 2014-06-22 DIAGNOSIS — Z8541 Personal history of malignant neoplasm of cervix uteri: Secondary | ICD-10-CM | POA: Diagnosis not present

## 2014-06-22 DIAGNOSIS — C539 Malignant neoplasm of cervix uteri, unspecified: Secondary | ICD-10-CM

## 2014-06-22 DIAGNOSIS — Z90710 Acquired absence of both cervix and uterus: Secondary | ICD-10-CM | POA: Diagnosis not present

## 2014-06-22 NOTE — Progress Notes (Signed)
New Patient Note: Gyn-Onc  Consult was requested by Dr. Lorenso Courier for the evaluation of [name] 54 y.o. female with a history of stage IB cervical cancer, and new microscopic hematuria.  CC:  Chief Complaint  Patient presents with  . Cervical Cancer  . microscopic hematuria    Assessment/Plan:  [name]  is a 54 y.o.  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 She was treated with radical hysterectomy and pelvic radiation. She now has microscopic hematuria and symptoms of radiation changes to the bladder (urinary frequency/decreased capacity).  She is NED from a cancer standpoint. We will followup the pap smear results from today, and if normal, will recommend repeat evaluation in 1 year. I do not see evidence for recurrence as the cause of her hematuria and have made a referral to Urology to rule out malignant pathology as an etiology. It is my suspicion that her symptoms are most likely secondary to radiation induced mucosal changes.  I will see her back in 1 year for followup.  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 She was treated with a radical hysterectomy and pelvic lymphadenectomy by Dr. Fermin Schwab, followed by adjuvant external beam radiation therapy to the pelvis due to the finding of metastatic disease in the pelvic lymph nodes. She completed therapy in July 2000. She declined concurrent radiosensitizing chemotherapy during her initial treatment. She underwent follow-up which was unremarkable until 2004 after which time she did not engage in additional cervical cancer surveillance visits. She is no history of recurrences.  In December 2015 as part of her workup and evaluation by her primary care physician Dr. Lorenso Courier she received a screening urinalysis which was positive for microscopic hematuria. The patient denies  macroscopic hematuria. She does report significant decreased bladder capacity and urinary frequency. She denies urinary retention or incontinence. The patient reports that her bladder symptoms have been stable since administration of radiation in the early part of last decade.  [name] reports that she has no new lower stream he edema, pelvic pain, vaginal bleeding. She is not sexually active after receiving radiation therapy.   Interval History: Patient feels well with no symptoms concerning for recurrence.  Current Meds:  Outpatient Encounter Prescriptions as of 06/22/2014  Medication Sig  . amLODipine (NORVASC) 10 MG tablet Take 1 tablet (10 mg total) by mouth daily.  Marland Kitchen lisinopril (PRINIVIL,ZESTRIL) 10 MG tablet Take 1 tablet (10 mg total) by mouth daily.  . metFORMIN (GLUCOPHAGE) 500 MG tablet Take 1 tablet (500 mg total) by mouth 2 (two) times daily with a meal.  . pravastatin (PRAVACHOL) 40 MG tablet Take 1 tablet (40 mg total) by mouth daily.  . [DISCONTINUED] diclofenac sodium (VOLTAREN) 1 % GEL Apply 2 g topically 4 (four) times daily. (Patient not taking: Reported on 05/18/2014)    Allergy:  Allergies  Allergen Reactions  . Penicillins     Social Hx:   History   Social History  . Marital Status: Single    Spouse Name: N/A    Number of Children: N/A  . Years of Education: N/A   Occupational History  . Not on file.   Social History Main Topics  . Smoking status: Former Smoker    Quit date: 06/05/1996  . Smokeless tobacco: Not on file  . Alcohol Use: 1.8 oz/week  3 Cans of beer per week     Comment: per day  . Drug Use: No  . Sexual Activity: Not Currently   Other Topics Concern  . Not on file   Social History Narrative    Past Surgical Hx:  Past Surgical History  Procedure Laterality Date  . Abdominal hysterectomy  2007    for cervical cancer  . Joint replacement      left hip replacement 3-5 years ago    Past Medical Hx:  Past Medical History   Diagnosis Date  . Sickle cell trait   . Cervical cancer 2007    s/p partial hysterectomy and radiation    Past Gynecological History:  Cervical cancer 2000. Sp hysterectomy and radiation.  No LMP recorded. Patient has had a hysterectomy.  Family Hx:  Family History  Problem Relation Age of Onset  . Cancer Mother     breast cancer, died at 60yo  . Seizures Father   . Diabetes Maternal Aunt   . Diabetes Maternal Uncle     Review of Systems:  Constitutional  Feels well,    ENT Normal appearing ears and nares bilaterally Skin/Breast  No rash, sores, jaundice, itching, dryness Cardiovascular  No chest pain, shortness of breath, or edema  Pulmonary  No cough or wheeze.  Gastro Intestinal  No nausea, vomitting, or diarrhoea. No bright red blood per rectum, no abdominal pain, change in bowel movement, or constipation.  Genito Urinary  + frequency, urgency, no dysuria Musculo Skeletal  No myalgia, arthralgia, joint swelling or pain  Neurologic  No weakness, numbness, change in gait,  Psychology  No depression, anxiety, insomnia.   Vitals:  Blood pressure 131/84, pulse 105, temperature 98.7 F (37.1 C), temperature source Oral, resp. rate 18, height 5\' 7"  (1.702 m), weight 277 lb 1.6 oz (125.692 kg).  Physical Exam: WD in NAD Neck  Supple NROM, without any enlargements.  Lymph Node Survey No cervical supraclavicular or inguinal adenopathy Cardiovascular  Pulse normal rate, regularity and rhythm. S1 and S2 normal.  Lungs  Clear to auscultation bilateraly, without wheezes/crackles/rhonchi. Good air movement.  Skin  No rash/lesions/breakdown  Psychiatry  Alert and oriented to person, place, and time  Abdomen  Normoactive bowel sounds, abdomen soft, non-tender and obese without evidence of hernia.  Back No CVA tenderness Genito Urinary  Vulva/vagina: Normal external female genitalia.  No lesions. No discharge or bleeding.  Bladder/urethra:  No lesions or masses,  well supported bladder  Vagina: non-rugated, atrophic, but with no lesions visible or palpable. Pap obtained.  Cervix: surgically absent  Uterus: surgically absent   Adnexa: no palpable masses. Rectal  Good tone, no masses no cul de sac nodularity.  Extremities  No bilateral cyanosis, clubbing or edema.  Donaciano Eva, MD   06/22/2014, 1:53 PM

## 2014-06-22 NOTE — Patient Instructions (Signed)
We will contact you with the results of your pap smear from today.  We will also call you with a date and time for an appointment at [facility].  Plan to follow up in one year or sooner if needed.  Please call closer to that time to schedule your appointment.  Please call for any questions or concerns.

## 2014-06-25 LAB — CYTOLOGY - PAP

## 2014-06-26 ENCOUNTER — Telehealth: Payer: Self-pay | Admitting: *Deleted

## 2014-06-26 NOTE — Telephone Encounter (Signed)
-----   Message from Dorothyann Gibbs, NP sent at 06/25/2014  2:18 PM EST ----- Please let her know that her pap is normal  ----- Message -----    From: [facility]    Sent: 06/25/2014   1:53 PM      To: Dorothyann Gibbs, NP

## 2014-06-26 NOTE — Telephone Encounter (Signed)
Called patient with results as noted below.

## 2015-01-15 ENCOUNTER — Other Ambulatory Visit: Payer: Self-pay | Admitting: Family Medicine

## 2015-01-18 NOTE — Telephone Encounter (Signed)
Rx filled for 1 month. Please call and have pt make f/u appt with me as we need to do bloodwork  Thanks, Archie Patten, MD [facility] Family Medicine Resident  01/18/2015, 1:33 PM

## 2015-01-19 NOTE — Telephone Encounter (Signed)
Left message on patient voicemail informing that she needs to schedule an appointment.

## 2015-03-12 ENCOUNTER — Other Ambulatory Visit: Payer: Self-pay | Admitting: Family Medicine

## 2015-04-13 ENCOUNTER — Encounter: Payer: Self-pay | Admitting: Family Medicine

## 2015-04-13 ENCOUNTER — Ambulatory Visit (INDEPENDENT_AMBULATORY_CARE_PROVIDER_SITE_OTHER): Payer: BLUE CROSS/BLUE SHIELD | Admitting: Family Medicine

## 2015-04-13 VITALS — BP 156/101 | HR 96 | Temp 98.8°F | Ht 67.0 in | Wt 284.2 lb

## 2015-04-13 DIAGNOSIS — R7303 Prediabetes: Secondary | ICD-10-CM | POA: Diagnosis not present

## 2015-04-13 DIAGNOSIS — Z23 Encounter for immunization: Secondary | ICD-10-CM | POA: Diagnosis not present

## 2015-04-13 DIAGNOSIS — I10 Essential (primary) hypertension: Secondary | ICD-10-CM

## 2015-04-13 DIAGNOSIS — R319 Hematuria, unspecified: Secondary | ICD-10-CM | POA: Diagnosis not present

## 2015-04-13 DIAGNOSIS — R3129 Other microscopic hematuria: Secondary | ICD-10-CM

## 2015-04-13 LAB — POCT UA - MICROSCOPIC ONLY

## 2015-04-13 LAB — POCT URINALYSIS DIPSTICK
BILIRUBIN UA: NEGATIVE
GLUCOSE UA: NEGATIVE
KETONES UA: NEGATIVE
LEUKOCYTES UA: NEGATIVE
Nitrite, UA: NEGATIVE
PH UA: 6
Protein, UA: NEGATIVE
Spec Grav, UA: 1.01
Urobilinogen, UA: 0.2

## 2015-04-13 LAB — POCT GLYCOSYLATED HEMOGLOBIN (HGB A1C): HEMOGLOBIN A1C: 6.5

## 2015-04-13 MED ORDER — AMLODIPINE BESYLATE 10 MG PO TABS: 10.0000 mg | ORAL_TABLET | Freq: Every day | ORAL | Status: DC

## 2015-04-13 MED ORDER — LISINOPRIL 20 MG PO TABS: 20.0000 mg | ORAL_TABLET | Freq: Every day | ORAL | Status: DC

## 2015-04-13 MED ORDER — METFORMIN HCL 500 MG PO TABS: 500.0000 mg | ORAL_TABLET | Freq: Two times a day (BID) | ORAL | Status: DC

## 2015-04-13 MED ORDER — PRAVASTATIN SODIUM 40 MG PO TABS: 40.0000 mg | ORAL_TABLET | Freq: Every day | ORAL | Status: DC

## 2015-04-13 NOTE — Assessment & Plan Note (Addendum)
BPs not under control.  - Will increase to lisinopril to 20mg  daily. - Continue amlodipine 10mg  daily - BMET today  - Follow up in 1 month for repeat BMET and to assess BP

## 2015-04-13 NOTE — Assessment & Plan Note (Signed)
A1c today is up to 6.5, meeting criteria for diabetes mellitus. No complications noted. - Refill metformin 500mg  BID, stressed importance of calling in for refills if needed.  - Will perform foot exam at next appt. - Will need annual retinal exams. - BMET today to assess creatinine.

## 2015-04-13 NOTE — Progress Notes (Signed)
Patient ID: [name], female   DOB: 10/03/1960, 54 y.o.   MRN: [id number]    Subjective: CC: f/u prediabetes and HTN  HPI: Patient is a 54 y.o. female with a past medical history of HTN and cervical cancer among other things presenting to clinic today for a f/u HTN and prediabetes.  Prediabetes:  CBGs at home: doesn't take Taking medications: states compliance with metformin 500mg  BID however I only provided her a 4 month supply in 12/15 and advised her to follow up which she never did. When asked about this the patient states sometimes she only takes 1 per day instead of BID but that she "always has it in her system"  Side effects: None  ROS: denies fever, chills, dizziness, LOC, polyuria, polydipsia, numbness or tingling in extremities or chest pain. 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 No formal exercise program. Eats "whatever she wants" with no intention of changing her eating habits.   Hypertension Blood pressure at home: doesn't check  Blood pressure today: 156/101, 140/95 Meds: Compliant with lisinopril per her report Side effects: none ROS: Denies headache, dizziness, visual changes, nausea, vomiting, chest pain, abdominal pain or shortness of breath.  Microscopic hematuria: concerns for radiation induced changes of the bladder after cervical cancer treatment. Was referred to urology but states it was $200-300 to be seen. No pain, no gross hematuria.    Social History: former smoker  ROS: All other systems reviewed and are negative.  Past Medical History Patient Active Problem List   Diagnosis Date Noted  . Microscopic hematuria 06/22/2014  . Diabetes ([place]) 05/18/2014  . Left shoulder pain 04/09/2012  . Metabolic syndrome 87/68/1157  . Hypertension 03/31/2012  . Urinary frequency 03/31/2012  . Malignant neoplasm of cervix uteri ([place]) 08/02/2006  . HIP PAINFUL,  ARTHRALGIA 08/02/2006    Medications- reviewed and updated Current Outpatient Prescriptions  Medication Sig Dispense Refill  . amLODipine (NORVASC) 10 MG tablet Take 1 tablet (10 mg total) by mouth daily. 30 tablet 11  . lisinopril (PRINIVIL,ZESTRIL) 20 MG tablet Take 1 tablet (20 mg total) by mouth daily. 30 tablet 11  . metFORMIN (GLUCOPHAGE) 500 MG tablet Take 1 tablet (500 mg total) by mouth 2 (two) times daily with a meal. 60 tablet 4  . pravastatin (PRAVACHOL) 40 MG tablet Take 1 tablet (40 mg total) by mouth daily. 30 tablet 2   No current facility-administered medications for this visit.    Objective: Office vital signs reviewed. BP 156/101 mmHg  Pulse 96  Temp(Src) 98.8 F (37.1 C) (Oral)  Ht 5\' 7"  (1.702 m)  Wt 284 lb 3.2 oz (128.912 kg)  BMI 44.50 kg/m2   Physical Examination:  General: Awake, alert, well- nourished, NAD Cardio: RRR, no m/r/g noted. No pitting edema.  Pulm: No increased WOB.  CTAB, without wheezes, rhonchi or crackles noted.  GI: soft, NT/ND,+BS x4, no hepatomegaly, no splenomegaly MSK: Normal gait and station Skin: dry, intact, no rashes or lesions  A1c 6.5   Assessment/Plan: Diabetes ([facility]) A1c today is up to 6.5, meeting criteria for diabetes mellitus. No complications noted. - Refill metformin 500mg  BID, stressed importance of calling in for refills if needed.  - Will perform foot exam at next appt. - Will need annual retinal exams. - BMET today to assess creatinine.   Hypertension BPs not under control.  - Will increase to lisinopril to 20mg  daily. - Continue amlodipine 10mg   daily - BMET today  - Follow up in 1 month for repeat BMET and to assess BP  Microscopic hematuria Repeat U/A today. Will refer again to urology and see if there is one that accepts her insurance. While likely related to radiation therapy from cervical cancer, would like to r/o malignancy.    Orders Placed This Encounter  Procedures  . Basic Metabolic Panel    . Ambulatory referral to Urology    Referral Priority:  Routine    Referral Type:  Consultation    Referral Reason:  Specialty Services Required    Requested Specialty:  Urology    Number of Visits Requested:  1  . POCT glycosylated hemoglobin (Hb A1C)  . POCT urinalysis dipstick  . POCT UA - Microscopic Only    Meds ordered this encounter  Medications  . lisinopril (PRINIVIL,ZESTRIL) 20 MG tablet    Sig: Take 1 tablet (20 mg total) by mouth daily.    Dispense:  30 tablet    Refill:  11  . metFORMIN (GLUCOPHAGE) 500 MG tablet    Sig: Take 1 tablet (500 mg total) by mouth 2 (two) times daily with a meal.    Dispense:  60 tablet    Refill:  4  . amLODipine (NORVASC) 10 MG tablet    Sig: Take 1 tablet (10 mg total) by mouth daily.    Dispense:  30 tablet    Refill:  11  . pravastatin (PRAVACHOL) 40 MG tablet    Sig: Take 1 tablet (40 mg total) by mouth daily.    Dispense:  30 tablet    Refill:  [place] PGY-2, [place]

## 2015-04-13 NOTE — Patient Instructions (Signed)
I have increased your lisinopril to 20mg  daily. Since this medication can change the electrolytes in your blood, please follow up with me in 1 month so we can recheck these and see how your blood pressure is doing.  I have refilled your metformin as well.  If you run out of medications please have your pharmacy contact our office for refills.

## 2015-04-13 NOTE — Assessment & Plan Note (Signed)
Repeat U/A today. Will refer again to urology and see if there is one that accepts her insurance. While likely related to radiation therapy from cervical cancer, would like to r/o malignancy.

## 2015-04-14 LAB — BASIC METABOLIC PANEL
BUN: 18 mg/dL (ref 7–25)
CHLORIDE: 105 mmol/L (ref 98–110)
CO2: 27 mmol/L (ref 20–31)
Calcium: 10.1 mg/dL (ref 8.6–10.4)
Creat: 0.79 mg/dL (ref 0.50–1.05)
GLUCOSE: 118 mg/dL — AB (ref 65–99)
POTASSIUM: 3.7 mmol/L (ref 3.5–5.3)
Sodium: 138 mmol/L (ref 135–146)

## 2015-05-07 ENCOUNTER — Other Ambulatory Visit: Payer: Self-pay

## 2015-05-07 DIAGNOSIS — Z1231 Encounter for screening mammogram for malignant neoplasm of breast: Secondary | ICD-10-CM

## 2015-05-27 ENCOUNTER — Ambulatory Visit: Payer: BLUE CROSS/BLUE SHIELD

## 2015-07-29 ENCOUNTER — Other Ambulatory Visit: Payer: Self-pay | Admitting: Family Medicine

## 2015-09-10 ENCOUNTER — Other Ambulatory Visit: Payer: Self-pay | Admitting: Family Medicine

## 2015-09-10 NOTE — Telephone Encounter (Signed)
Rx filled.  Algis Greenhouse. Jerline Pain, [place] [facility] Resident PGY-2 09/10/2015 12:53 PM

## 2015-09-19 ENCOUNTER — Other Ambulatory Visit: Payer: Self-pay | Admitting: Family Medicine

## 2015-12-05 ENCOUNTER — Other Ambulatory Visit: Payer: Self-pay | Admitting: Family Medicine

## 2015-12-08 ENCOUNTER — Ambulatory Visit
Admission: RE | Admit: 2015-12-08 | Discharge: 2015-12-08 | Disposition: A | Discharge status: Home / Self Care | Payer: BLUE CROSS/BLUE SHIELD | Source: Ambulatory Visit

## 2015-12-08 DIAGNOSIS — Z1231 Encounter for screening mammogram for malignant neoplasm of breast: Secondary | ICD-10-CM | POA: Diagnosis not present

## 2016-01-13 ENCOUNTER — Other Ambulatory Visit: Payer: Self-pay | Admitting: Family Medicine

## 2016-02-25 ENCOUNTER — Other Ambulatory Visit: Payer: Self-pay | Admitting: Family Medicine

## 2016-02-25 DIAGNOSIS — R7303 Prediabetes: Secondary | ICD-10-CM

## 2016-05-06 ENCOUNTER — Other Ambulatory Visit: Payer: Self-pay | Admitting: Family Medicine

## 2016-06-01 ENCOUNTER — Other Ambulatory Visit: Payer: Self-pay | Admitting: Family Medicine

## 2016-06-01 DIAGNOSIS — R7303 Prediabetes: Secondary | ICD-10-CM

## 2016-06-01 MED ORDER — METFORMIN HCL 500 MG PO TABS: 500.0000 mg | ORAL_TABLET | Freq: Two times a day (BID) | ORAL | 4 refills | Status: DC

## 2016-06-01 NOTE — Telephone Encounter (Signed)
Pt needs a refill on metformin. Pt uses [facility] @ [address] ep

## 2016-06-30 ENCOUNTER — Other Ambulatory Visit: Payer: Self-pay | Admitting: Family Medicine

## 2016-06-30 NOTE — Telephone Encounter (Signed)
Tried calling patient, no answer and mailbox full. If patient calls back please inform that MD gave her multiple refills when she refilled her medication in December. Patient needs to contact pharmacy for refills.

## 2016-06-30 NOTE — Telephone Encounter (Signed)
Pt needs a refill on everything. Pt uses [facility] @ [address] ep

## 2016-09-08 ENCOUNTER — Other Ambulatory Visit: Payer: Self-pay | Admitting: Family Medicine

## 2016-10-04 ENCOUNTER — Ambulatory Visit (INDEPENDENT_AMBULATORY_CARE_PROVIDER_SITE_OTHER): Payer: BLUE CROSS/BLUE SHIELD | Admitting: Family Medicine

## 2016-10-04 ENCOUNTER — Encounter: Payer: Self-pay | Admitting: Family Medicine

## 2016-10-04 VITALS — BP 112/80 | HR 110 | Temp 100.3°F | Wt 278.0 lb

## 2016-10-04 DIAGNOSIS — R197 Diarrhea, unspecified: Secondary | ICD-10-CM | POA: Diagnosis not present

## 2016-10-04 NOTE — Patient Instructions (Addendum)
It was good to see you today.  For your diarrhea/black tongue, - Please stop taking your diet pill. - Stay well hydrated while this passes. - We believe your tongue is due to pepto bismol.   Take care and seek immediate care sooner if you develop any concerns.   Dr. Bufford Lope, [place]

## 2016-10-04 NOTE — Progress Notes (Signed)
    Subjective:  [name] is a 56 y.o. female who presents to the [facility] today with a chief complaint of diarrhea and black tongue.  HPI:  Was in usual state of health yesterday. Suddenly around 1pm started having diarrhea, > 10 episodes in the last 24 hours. Stool has appeared dark greenish without blood. May have felt warm with chills but is uncertain. Woke up today with black tongue after taking 2 pepto bismol yesterday. No sick contacts. Poor no appetite but no nausea/vomiting. Recently started diet pill called Zantrex Black over the last 1-2 weeks, taking 2 pills a day.   ROS: Per HPI  Objective:  Physical Exam: BP 112/80   Pulse (!) 110   Temp 100.3 F (37.9 C) (Oral)   Wt 278 lb (126.1 kg)   SpO2 99%   BMI 43.54 kg/m   Gen: NAD, resting comfortably HEENT: [place], AT. Normal conjunctiva. TMs clear bilaterally. Oropharynx unremarkable. Tongue with scrapable black coating CV: RRR with no murmurs appreciated Pulm: NWOB, CTAB with no crackles, wheezes, or rhonchi GI: Soft, very slightly TTP over b/l abdomen, Nondistended, normal bowel sounds MSK: no edema, cyanosis, or clubbing noted Skin: warm, dry Neuro: grossly normal, moves all extremities Psych: Normal affect and thought content   Assessment/Plan:  Diarrhea Diet pills Zantrex Black 1160mg  caffeine per pill and can cause diarrhea. Since patient is overall well appearing and has no n/v, this could be very likely. However also has Tmax 100.67F so viral process is also possible. Black tongue is very consistent with pepto bismol use. - Encourage good po hydration and supportive care. - Stop diet pills.  Bufford Lope, DO PGY-1, [facility] 10/04/2016 9:20 AM

## 2016-11-13 ENCOUNTER — Other Ambulatory Visit: Payer: Self-pay | Admitting: Family Medicine

## 2016-11-13 DIAGNOSIS — Z1231 Encounter for screening mammogram for malignant neoplasm of breast: Secondary | ICD-10-CM

## 2016-12-11 ENCOUNTER — Ambulatory Visit
Admission: RE | Admit: 2016-12-11 | Discharge: 2016-12-11 | Disposition: A | Discharge status: Home / Self Care | Payer: BLUE CROSS/BLUE SHIELD | Source: Ambulatory Visit | Attending: Family Medicine | Admitting: Family Medicine

## 2016-12-11 DIAGNOSIS — Z1231 Encounter for screening mammogram for malignant neoplasm of breast: Secondary | ICD-10-CM | POA: Diagnosis not present

## 2016-12-13 ENCOUNTER — Encounter: Payer: Self-pay | Admitting: Family Medicine

## 2017-01-01 ENCOUNTER — Other Ambulatory Visit: Payer: Self-pay | Admitting: *Deleted

## 2017-01-02 ENCOUNTER — Other Ambulatory Visit: Payer: Self-pay | Admitting: Family Medicine

## 2017-01-02 MED ORDER — PRAVASTATIN SODIUM 40 MG PO TABS: 40.0000 mg | ORAL_TABLET | Freq: Every day | ORAL | 3 refills | Status: DC

## 2017-06-01 ENCOUNTER — Other Ambulatory Visit: Payer: Self-pay | Admitting: Family Medicine

## 2017-06-01 DIAGNOSIS — R7303 Prediabetes: Secondary | ICD-10-CM

## 2017-06-01 MED ORDER — LISINOPRIL 20 MG PO TABS: 20.0000 mg | ORAL_TABLET | Freq: Every day | ORAL | 11 refills | Status: DC

## 2017-06-01 MED ORDER — AMLODIPINE BESYLATE 10 MG PO TABS: 10.0000 mg | ORAL_TABLET | Freq: Every day | ORAL | 11 refills | Status: DC

## 2017-06-01 MED ORDER — METFORMIN HCL 500 MG PO TABS
500.0000 mg | ORAL_TABLET | Freq: Two times a day (BID) | ORAL | 5 refills | Status: DC
Start: 2017-06-01 — End: 2018-08-02

## 2017-11-29 ENCOUNTER — Other Ambulatory Visit: Payer: Self-pay | Admitting: Family Medicine

## 2017-12-10 ENCOUNTER — Other Ambulatory Visit: Payer: Self-pay | Admitting: Family Medicine

## 2017-12-10 DIAGNOSIS — Z1231 Encounter for screening mammogram for malignant neoplasm of breast: Secondary | ICD-10-CM

## 2017-12-31 ENCOUNTER — Ambulatory Visit
Admission: RE | Admit: 2017-12-31 | Discharge: 2017-12-31 | Disposition: A | Discharge status: Home / Self Care | Payer: BLUE CROSS/BLUE SHIELD | Source: Ambulatory Visit | Attending: Family Medicine | Admitting: Family Medicine

## 2017-12-31 DIAGNOSIS — Z1231 Encounter for screening mammogram for malignant neoplasm of breast: Secondary | ICD-10-CM

## 2018-06-29 ENCOUNTER — Other Ambulatory Visit: Payer: Self-pay | Admitting: Family Medicine

## 2018-08-02 ENCOUNTER — Other Ambulatory Visit: Payer: Self-pay | Admitting: Family Medicine

## 2018-08-02 DIAGNOSIS — R7303 Prediabetes: Secondary | ICD-10-CM

## 2018-08-02 MED ORDER — AMLODIPINE BESYLATE 10 MG PO TABS: 10.0000 mg | ORAL_TABLET | Freq: Every day | ORAL | 0 refills | Status: DC

## 2018-08-02 MED ORDER — PRAVASTATIN SODIUM 40 MG PO TABS: 40.0000 mg | ORAL_TABLET | Freq: Every day | ORAL | 0 refills | Status: DC

## 2018-08-02 MED ORDER — METFORMIN HCL 500 MG PO TABS: 500.0000 mg | ORAL_TABLET | Freq: Two times a day (BID) | ORAL | 0 refills | Status: DC

## 2018-08-02 NOTE — Telephone Encounter (Signed)
Pt wonders why she only get 30 at a time.  Advised she has not been seen since 2018 and needs an appt.  Agreeable to making appt on 08/21/18.  Request refills till then.  To MD. Clinton Sawyer, Salome Spotted, [place]

## 2018-08-14 ENCOUNTER — Encounter: Payer: Self-pay | Admitting: Family Medicine

## 2018-08-14 ENCOUNTER — Other Ambulatory Visit: Payer: Self-pay

## 2018-08-14 ENCOUNTER — Ambulatory Visit (INDEPENDENT_AMBULATORY_CARE_PROVIDER_SITE_OTHER): Payer: BLUE CROSS/BLUE SHIELD | Admitting: Family Medicine

## 2018-08-14 VITALS — BP 140/85 | HR 103 | Temp 99.4°F | Wt 297.4 lb

## 2018-08-14 DIAGNOSIS — E119 Type 2 diabetes mellitus without complications: Secondary | ICD-10-CM | POA: Insufficient documentation

## 2018-08-14 DIAGNOSIS — C539 Malignant neoplasm of cervix uteri, unspecified: Secondary | ICD-10-CM

## 2018-08-14 DIAGNOSIS — R7303 Prediabetes: Secondary | ICD-10-CM

## 2018-08-14 DIAGNOSIS — Z23 Encounter for immunization: Secondary | ICD-10-CM | POA: Diagnosis not present

## 2018-08-14 DIAGNOSIS — I10 Essential (primary) hypertension: Secondary | ICD-10-CM

## 2018-08-14 DIAGNOSIS — Z114 Encounter for screening for human immunodeficiency virus [HIV]: Secondary | ICD-10-CM | POA: Diagnosis not present

## 2018-08-14 DIAGNOSIS — E779 Disorder of glycoprotein metabolism, unspecified: Secondary | ICD-10-CM | POA: Diagnosis not present

## 2018-08-14 LAB — POCT GLYCOSYLATED HEMOGLOBIN (HGB A1C): HbA1c, POC (controlled diabetic range): 6.5 % (ref 0.0–7.0)

## 2018-08-14 MED ORDER — METFORMIN HCL 1000 MG PO TABS: 1000.0000 mg | ORAL_TABLET | Freq: Two times a day (BID) | ORAL | 3 refills | Status: DC

## 2018-08-14 MED ORDER — PRAVASTATIN SODIUM 40 MG PO TABS: 40.0000 mg | ORAL_TABLET | Freq: Every day | ORAL | 3 refills | Status: DC

## 2018-08-14 MED ORDER — LISINOPRIL 20 MG PO TABS: 20.0000 mg | ORAL_TABLET | Freq: Every day | ORAL | 3 refills | Status: DC

## 2018-08-14 MED ORDER — AMLODIPINE BESYLATE 10 MG PO TABS: 10.0000 mg | ORAL_TABLET | Freq: Every day | ORAL | 3 refills | Status: DC

## 2018-08-14 NOTE — Progress Notes (Signed)
Subjective   Patient ID: [name]    DOB: 07-03-1960, 58 y.o. female   MRN: [id number]  CC: "Diabetes"  HPI: [name] is a 58 y.o. female who presents to clinic today for the following:  Diabetes: History of symptomatic diabetes with A1c 6.5 approximately 4 years ago.  She has currently been on metformin 500 mg twice daily but has been out of her medication for the last month.  She does not exercise and does not monitor her carbohydrate intake.  She is motivated today to begin working towards making lifestyle modifications.  She is overdue for all of her health maintenance including vaccinations for pneumonia and flu.  Hypertension: Has history of diabetes.  Has been off of her lisinopril and amlodipine for over 1 month due to expiration of medications.  She denies chest pain, shortness of breath, lower extremity swelling, change in vision.  ROS: see HPI for pertinent.  [place]: H/o cervical cancer s/p partial hysterectomy and radiation, sickle cell trait, obesity, NIDDM, HTN, arthritis.  Surgical history abdominal hysterectomy, left hip replacement.  Family history cancer (mother age 37, breast), seizures.  Smoking status reviewed. Medications reviewed.  Objective   BP 140/85   Pulse (!) 103   Temp 99.4 F (37.4 C) (Oral)   Wt 297 lb 6.4 oz (134.9 kg)   SpO2 92%   BMI 46.58 kg/m  Vitals and nursing note reviewed.  General: well nourished, well developed, NAD with non-toxic appearance HEENT: normocephalic, atraumatic, moist mucous membranes Neck: supple, non-tender without lymphadenopathy Cardiovascular: regular rate and rhythm without murmurs, rubs, or gallops Lungs: clear to auscultation bilaterally with normal work of breathing Abdomen: obese, soft, non-tender, non-distended, normoactive bowel sounds Skin: warm, dry, no rashes or lesions, cap refill < 2 seconds Extremities: warm and well perfused, normal tone, no edema  Assessment & Plan   Primary hypertension  Chronic.  Uncontrolled due to nonadherence.  Non-smoker. - Given refill for amlodipine 10 mg daily, lisinopril 20 mg daily, and pravastatin 40 mg daily  Controlled type 2 diabetes mellitus without complication, without long-term current use of insulin (HCC) Chronic.  A1c controlled at 6.5.  Has been off metformin for an extended time.  Morbidly obese with other comorbidities including HTN. - Increasing metformin to 1000 mg twice daily - Discussed importance of lifestyle modifications with plans to follow-up in 1 month to set smart goals - Checking CBC, CMAC, TSH, lipid panel, microalbumin - Administered flu, Tdap, and Pneumovax  Orders Placed This Encounter  Procedures  . CBC  . Comprehensive metabolic panel    Order Specific Question:   Has the patient fasted?    Answer:   No  . TSH  . Lipid Panel    Order Specific Question:   Has the patient fasted?    Answer:   No  . Microalbumin/Creatinine Ratio, Urine  . HIV antibody (with reflex)  . Ambulatory referral to Ophthalmology    Referral Priority:   Routine    Referral Type:   Consultation    Referral Reason:   Specialty Services Required    Requested Specialty:   Ophthalmology    Number of Visits Requested:   1  . HgB A1c   Meds ordered this encounter  Medications  . metFORMIN (GLUCOPHAGE) 1000 MG tablet    Sig: Take 1 tablet (1,000 mg total) by mouth 2 (two) times daily with a meal.    Dispense:  90 tablet    Refill:  3  Must keep appt for more refills  . lisinopril (PRINIVIL,ZESTRIL) 20 MG tablet    Sig: Take 1 tablet (20 mg total) by mouth daily.    Dispense:  90 tablet    Refill:  3    Must keep appt for more refills  . pravastatin (PRAVACHOL) 40 MG tablet    Sig: Take 1 tablet (40 mg total) by mouth daily.    Dispense:  90 tablet    Refill:  3    Must keep appt for more refills  . amLODipine (NORVASC) 10 MG tablet    Sig: Take 1 tablet (10 mg total) by mouth daily.    Dispense:  90 tablet    Refill:  3     Must keep appt for more refills    Harriet Butte, [place], PGY-3 08/14/2018, 4:27 PM

## 2018-08-14 NOTE — Patient Instructions (Signed)
Thank you for coming in to see [place] today. Please see below to review our plan for today's visit.  1.  Increase your metformin to 2 pills twice daily.  I will have you follow-up in 1 month to discuss the next steps. 2.  We will call you if your blood work is abnormal within 48 hours, otherwise you should receive results in the mail. 3.  Please call and schedule a screening colonoscopy with the phone number I provided you. 4.  Follow-up with me in 1 month.  Please call the clinic at [phone] if your symptoms worsen or you have any concerns. It was our pleasure to serve you.  Harriet Butte, [place], PGY-3

## 2018-08-14 NOTE — Assessment & Plan Note (Addendum)
Chronic.  Uncontrolled due to nonadherence.  Non-smoker. - Given refill for amlodipine 10 mg daily, lisinopril 20 mg daily, and pravastatin 40 mg daily

## 2018-08-14 NOTE — Assessment & Plan Note (Signed)
Chronic.  A1c controlled at 6.5.  Has been off metformin for an extended time.  Morbidly obese with other comorbidities including HTN. - Increasing metformin to 1000 mg twice daily - Discussed importance of lifestyle modifications with plans to follow-up in 1 month to set smart goals - Checking CBC, CMAC, TSH, lipid panel, microalbumin - Administered flu, Tdap, and Pneumovax

## 2018-08-14 NOTE — Addendum Note (Signed)
Addended by: Leonia Corona R on: 08/14/2018 04:43 PM   Modules accepted: Orders

## 2018-08-15 LAB — CBC
Hematocrit: 36.3 % (ref 34.0–46.6)
Hemoglobin: 12.4 g/dL (ref 11.1–15.9)
MCH: 30.5 pg (ref 26.6–33.0)
MCHC: 34.2 g/dL (ref 31.5–35.7)
MCV: 89 fL (ref 79–97)
PLATELETS: 248 10*3/uL (ref 150–450)
RBC: 4.07 x10E6/uL (ref 3.77–5.28)
RDW: 13.6 % (ref 11.7–15.4)
WBC: 5.2 10*3/uL (ref 3.4–10.8)

## 2018-08-15 LAB — COMPREHENSIVE METABOLIC PANEL
A/G RATIO: 1.4 (ref 1.2–2.2)
ALT: 21 IU/L (ref 0–32)
AST: 18 IU/L (ref 0–40)
Albumin: 4.2 g/dL (ref 3.8–4.9)
Alkaline Phosphatase: 59 IU/L (ref 39–117)
BUN/Creatinine Ratio: 15 (ref 9–23)
BUN: 11 mg/dL (ref 6–24)
Bilirubin Total: <0.2 mg/dL (ref 0.0–1.2)
CO2: 25 mmol/L (ref 20–29)
Calcium: 10.1 mg/dL (ref 8.7–10.2)
Chloride: 106 mmol/L (ref 96–106)
Creatinine, Ser: 0.74 mg/dL (ref 0.57–1.00)
GFR calc Af Amer: 103 mL/min/{1.73_m2} (ref >59)
GFR calc non Af Amer: 89 mL/min/{1.73_m2} (ref >59)
Globulin, Total: 2.9 g/dL (ref 1.5–4.5)
Glucose: 102 mg/dL — ABNORMAL HIGH (ref 65–99)
POTASSIUM: 4.3 mmol/L (ref 3.5–5.2)
Sodium: 144 mmol/L (ref 134–144)
Total Protein: 7.1 g/dL (ref 6.0–8.5)

## 2018-08-15 LAB — LIPID PANEL
Chol/HDL Ratio: 2.4 ratio (ref 0.0–4.4)
Cholesterol, Total: 176 mg/dL (ref 100–199)
HDL: 72 mg/dL (ref >39)
LDL Calculated: 71 mg/dL (ref 0–99)
Triglycerides: 167 mg/dL — ABNORMAL HIGH (ref 0–149)
VLDL Cholesterol Cal: 33 mg/dL (ref 5–40)

## 2018-08-15 LAB — TSH: TSH: 2.55 u[IU]/mL (ref 0.450–4.500)

## 2018-08-15 LAB — MICROALBUMIN / CREATININE URINE RATIO
Creatinine, Urine: 87.1 mg/dL
MICROALB/CREAT RATIO: 69 mg/g{creat} — AB (ref 0–29)
Microalbumin, Urine: 60.5 ug/mL

## 2018-08-15 LAB — HIV ANTIBODY (ROUTINE TESTING W REFLEX): HIV Screen 4th Generation wRfx: NONREACTIVE

## 2018-08-16 ENCOUNTER — Encounter: Payer: Self-pay | Admitting: Family Medicine

## 2018-09-30 ENCOUNTER — Ambulatory Visit: Payer: BLUE CROSS/BLUE SHIELD | Admitting: Family Medicine

## 2018-12-03 ENCOUNTER — Other Ambulatory Visit: Payer: Self-pay | Admitting: Family Medicine

## 2018-12-03 DIAGNOSIS — Z1231 Encounter for screening mammogram for malignant neoplasm of breast: Secondary | ICD-10-CM

## 2018-12-08 NOTE — Progress Notes (Deleted)
Subjective:   [name] is a 58 y.o. female with a history of *** here for an annual gynecological exam.   Gyn concerns/Preventative healthcare  Last menstrual period: No LMP recorded. Patient has had a hysterectomy.  Regular periods:   Heavy bleeding: no***  Sexually active: yes***  Contraception or hormonal therapy:***  Hx of STD: Patient ***desire STD screening  Dyspareunia: No***  Hot flashes: No***  Vaginal discharge: ***  Dysuria:No ***  Last mammogram: ***  Breast mass or concerns: No*** Last pap smear: 2016, showing NEGATIVE FOR INTRAEPITHELIAL LESIONS OR MALIGNANCY. BENIGN REACTIVE/REPARATIVE CHANGES. ACUTE INFLAMMATION.    PMH, Surgical Hx, Family Hx, Social History reviewed and updated as below.  Past Medical History:  Diagnosis Date  . Cervical cancer ([place]) 2007   s/p partial hysterectomy and radiation  . Sickle cell trait [facility])    Past Surgical History:  Procedure Laterality Date  . ABDOMINAL HYSTERECTOMY  2007   for cervical cancer  . JOINT REPLACEMENT     left hip replacement 3-5 years ago   Family History  Problem Relation Age of Onset  . Cancer Mother        breast cancer, died at 11yo  . Breast cancer Mother 34  . Seizures Father   . Diabetes Maternal Aunt   . Diabetes Maternal Uncle    Social History   Socioeconomic History  . Marital status: Single    Spouse name: Not on file  . Number of children: Not on file  . Years of education: Not on file  . Highest education level: Not on file  Occupational History  . Not on file  Social Needs  . Financial resource strain: Not on file  . Food insecurity    Worry: Not on file    Inability: Not on file  . Transportation needs    Medical: Not on file    Non-medical: Not on file  Tobacco Use  . Smoking status: Former Smoker    Quit date: 06/05/1996    Years since quitting: 22.5  . Smokeless tobacco: Never Used  Substance and Sexual Activity  . Alcohol use: Yes   Alcohol/week: 3.0 standard drinks    Types: 3 Cans of beer per week    Comment: per day  . Drug use: No  . Sexual activity: Not Currently  Lifestyle  . Physical activity    Days per week: Not on file    Minutes per session: Not on file  . Stress: Not on file  Relationships  . Social Herbalist on phone: Not on file    Gets together: Not on file    Attends religious service: Not on file    Active member of club or organization: Not on file    Attends meetings of clubs or organizations: Not on file    Relationship status: Not on file  Other Topics Concern  . Not on file  Social History Narrative  . Not on file    Review of Systems:  Per HPI. Otherwise a complete 10 point ROS was negative.    Objective:  There were no vitals filed for this visit. Exam: General: well appearing, NAD. HEENT: NCAT. Cardiovascular: RRR. No murmurs, rubs, or gallops. Respiratory: CTAB. No rales, rhonchi, or wheeze. Abdomen: soft, nontender, nondistended. Extremities: warm, well perfused. No LE edema. Skin: Warm, dry, intact. Neuro: No focal deficits.  Pelvic Exam:        External: normal female genitalia without lesions or  masses        Vagina: normal without lesions or masses        Cervix: normal without lesions or masses        Pap smear: performed        Samples for Wet prep, GC/Chlamydia obtained    Chemistry      Component Value Date/Time   NA 144 08/14/2018 1653   K 4.3 08/14/2018 1653   CL 106 08/14/2018 1653   CO2 25 08/14/2018 1653   BUN 11 08/14/2018 1653   CREATININE 0.74 08/14/2018 1653   CREATININE 0.79 04/13/2015 1610      Component Value Date/Time   CALCIUM 10.1 08/14/2018 1653   ALKPHOS 59 08/14/2018 1653   AST 18 08/14/2018 1653   ALT 21 08/14/2018 1653   BILITOT <0.2 08/14/2018 1653      Lab Results  Component Value Date   WBC 5.2 08/14/2018   HGB 12.4 08/14/2018   HCT 36.3 08/14/2018   MCV 89 08/14/2018   PLT 248 08/14/2018   Lab Results   Component Value Date   TSH 2.550 08/14/2018   Lab Results  Component Value Date   HGBA1C 6.5 08/14/2018   Assessment/Plan:   No problem-specific Assessment & Plan notes found for this encounter.    No follow-ups on file.   Caroline More, DO, PGY-3

## 2018-12-10 ENCOUNTER — Ambulatory Visit: Payer: BLUE CROSS/BLUE SHIELD | Admitting: Family Medicine

## 2018-12-18 NOTE — Progress Notes (Deleted)
Subjective:   [name] is a 58 y.o. female with a history of *** here for an annual gynecological exam.   Gyn concerns/Preventative healthcare  Last menstrual period: No LMP recorded. Patient has had a hysterectomy.  Regular periods:   Heavy bleeding: no***  Sexually active: yes***  Contraception or hormonal therapy:***  Hx of STD: Patient ***desire STD screening  Dyspareunia: No***  Hot flashes: No***  Vaginal discharge: ***  Dysuria:No ***  Last mammogram: ***  Breast mass or concerns: No***  Last pap smear: 2016: NEGATIVE FOR INTRAEPITHELIAL LESIONS OR MALIGNANCY. BENIGN REACTIVE/REPARATIVE CHANGES. ACUTE INFLAMMATION.   H/o cervical cancer status post radical hysterectomy and adjuvant radiation due to positive lymph nodes,   PMH, Surgical Hx, Family Hx, Social History reviewed and updated as below.  Past Medical History:  Diagnosis Date  . Cervical cancer ([place]) 2007   s/p partial hysterectomy and radiation  . Sickle cell trait [facility])    Past Surgical History:  Procedure Laterality Date  . ABDOMINAL HYSTERECTOMY  2007   for cervical cancer  . JOINT REPLACEMENT     left hip replacement 3-5 years ago   Family History  Problem Relation Age of Onset  . Cancer Mother        breast cancer, died at 18yo  . Breast cancer Mother 38  . Seizures Father   . Diabetes Maternal Aunt   . Diabetes Maternal Uncle    Social History   Socioeconomic History  . Marital status: Single    Spouse name: Not on file  . Number of children: Not on file  . Years of education: Not on file  . Highest education level: Not on file  Occupational History  . Not on file  Social Needs  . Financial resource strain: Not on file  . Food insecurity    Worry: Not on file    Inability: Not on file  . Transportation needs    Medical: Not on file    Non-medical: Not on file  Tobacco Use  . Smoking status: Former Smoker    Quit date: 06/05/1996    Years since quitting: 22.5   . Smokeless tobacco: Never Used  Substance and Sexual Activity  . Alcohol use: Yes    Alcohol/week: 3.0 standard drinks    Types: 3 Cans of beer per week    Comment: per day  . Drug use: No  . Sexual activity: Not Currently  Lifestyle  . Physical activity    Days per week: Not on file    Minutes per session: Not on file  . Stress: Not on file  Relationships  . Social Herbalist on phone: Not on file    Gets together: Not on file    Attends religious service: Not on file    Active member of club or organization: Not on file    Attends meetings of clubs or organizations: Not on file    Relationship status: Not on file  Other Topics Concern  . Not on file  Social History Narrative  . Not on file    Review of Systems:  Per HPI. Otherwise a complete 10 point ROS was negative.    Objective:  There were no vitals filed for this visit. Exam: General: well appearing, NAD. HEENT: NCAT. Cardiovascular: RRR. No murmurs, rubs, or gallops. Respiratory: CTAB. No rales, rhonchi, or wheeze. Abdomen: soft, nontender, nondistended. Extremities: warm, well perfused. No LE edema. Skin: Warm, dry, intact. Neuro: No focal  deficits.  Pelvic Exam:        External: normal female genitalia without lesions or masses        Vagina: normal without lesions or masses        Cervix: normal without lesions or masses        Pap smear: performed        Samples for Wet prep, GC/Chlamydia obtained    Chemistry      Component Value Date/Time   NA 144 08/14/2018 1653   K 4.3 08/14/2018 1653   CL 106 08/14/2018 1653   CO2 25 08/14/2018 1653   BUN 11 08/14/2018 1653   CREATININE 0.74 08/14/2018 1653   CREATININE 0.79 04/13/2015 1610      Component Value Date/Time   CALCIUM 10.1 08/14/2018 1653   ALKPHOS 59 08/14/2018 1653   AST 18 08/14/2018 1653   ALT 21 08/14/2018 1653   BILITOT <0.2 08/14/2018 1653      Lab Results  Component Value Date   WBC 5.2 08/14/2018   HGB 12.4  08/14/2018   HCT 36.3 08/14/2018   MCV 89 08/14/2018   PLT 248 08/14/2018   Lab Results  Component Value Date   TSH 2.550 08/14/2018   Lab Results  Component Value Date   HGBA1C 6.5 08/14/2018   Assessment/Plan:   No problem-specific Assessment & Plan notes found for this encounter.    No follow-ups on file.   Caroline More, DO, PGY-3

## 2018-12-20 ENCOUNTER — Ambulatory Visit: Payer: BC Managed Care – PPO | Admitting: Family Medicine

## 2019-01-17 ENCOUNTER — Ambulatory Visit: Payer: BLUE CROSS/BLUE SHIELD

## 2019-02-11 ENCOUNTER — Ambulatory Visit: Payer: BC Managed Care – PPO | Admitting: Family Medicine

## 2019-02-11 NOTE — Progress Notes (Deleted)
   Subjective:    Patient ID: [name], female    DOB: 03/01/1961, 58 y.o.   MRN: [id number]   CC: papsmear   HPI: [name]    Smoking status reviewed  Review of Systems Per HPI, also denies recent illness, fever, headache, changes in vision, chest pain, shortness of breath, abdominal pain, N/V/D, weakness   Patient Active Problem List   Diagnosis Date Noted  . Controlled type 2 diabetes mellitus without complication, without long-term current use of insulin ([place]) 08/14/2018  . Metabolic syndrome [id number]  . Primary hypertension 03/31/2012  . Malignant neoplasm of cervix uteri ([place]) 08/02/2006  . HIP PAINFUL, ARTHRALGIA 08/02/2006     Objective:  There were no vitals taken for this visit. Vitals and nursing note reviewed  General: NAD, pleasant Cardiac: RRR, normal heart sounds, no murmurs Respiratory: CTAB, normal effort Abdomen: soft, nontender, nondistended Extremities: no edema or cyanosis. WWP. Skin: warm and dry, no rashes noted Neuro: alert and oriented, no focal deficits Psych: normal affect  Assessment & Plan:    No problem-specific Assessment & Plan notes found for this encounter.    [place] [facility] Resident PGY-2

## 2019-02-25 ENCOUNTER — Ambulatory Visit
Admission: RE | Admit: 2019-02-25 | Discharge: 2019-02-25 | Disposition: A | Discharge status: Home / Self Care | Payer: BC Managed Care – PPO | Source: Ambulatory Visit | Attending: Family Medicine | Admitting: Family Medicine

## 2019-02-25 ENCOUNTER — Other Ambulatory Visit: Payer: Self-pay

## 2019-02-25 DIAGNOSIS — Z1231 Encounter for screening mammogram for malignant neoplasm of breast: Secondary | ICD-10-CM

## 2019-03-21 ENCOUNTER — Ambulatory Visit (INDEPENDENT_AMBULATORY_CARE_PROVIDER_SITE_OTHER): Payer: BC Managed Care – PPO | Admitting: Student in an Organized Health Care Education/Training Program

## 2019-03-21 ENCOUNTER — Other Ambulatory Visit: Payer: Self-pay

## 2019-03-21 ENCOUNTER — Ambulatory Visit
Admission: RE | Admit: 2019-03-21 | Discharge: 2019-03-21 | Disposition: A | Discharge status: Home / Self Care | Payer: BC Managed Care – PPO | Source: Ambulatory Visit | Attending: Family Medicine | Admitting: Family Medicine

## 2019-03-21 VITALS — BP 135/80 | HR 97 | Wt 285.2 lb

## 2019-03-21 DIAGNOSIS — Z23 Encounter for immunization: Secondary | ICD-10-CM | POA: Diagnosis not present

## 2019-03-21 DIAGNOSIS — M545 Low back pain, unspecified: Secondary | ICD-10-CM

## 2019-03-21 MED ORDER — NAPROXEN 500 MG PO TABS: 500.0000 mg | ORAL_TABLET | Freq: Two times a day (BID) | ORAL | 0 refills | Status: DC

## 2019-03-21 MED ORDER — BACLOFEN 10 MG PO TABS: 10.0000 mg | ORAL_TABLET | Freq: Three times a day (TID) | ORAL | 0 refills | Status: DC | PRN
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
## 2019-03-21 NOTE — Assessment & Plan Note (Signed)
Given acute nature and suspicion for compression fracture vertebrae M ordering lumbar spine x-ray for patient. Prescribing naproxen and baclofen for acute pain. If pain continues greater than 3 weeks would recommend physical therapy.

## 2019-03-21 NOTE — Patient Instructions (Signed)
It was a pleasure to see you today!  To summarize our discussion for this visit:  I am sorry to hear that you are having back pain.  I am sending an order for you to get an x-ray of your back today so that we can get a better idea of what is causing this pain.  Once we know what we are looking at we can treat according to that.  In the meantime, please take over-the-counter Tylenol up to 2 g/day and you can alternate ibuprofen but please do not take too much of that because of your kidney function can be impacted.   Call the clinic at [phone] if your symptoms worsen or you have any concerns.   Thank you for allowing me to take part in your care,  Dr. Doristine Mango

## 2019-03-27 NOTE — Progress Notes (Deleted)
   Subjective:    Patient ID: [name], female    DOB: Mar 02, 1961, 58 y.o.   MRN: [id number]   CC:  HPI: Back pain Cephalic presentation, anterior placental lie, 238.26 g, 35.2%.  Patient evaluated on 03/21/2019 by Dr. Ermalene Searing.  At that time x-ray was ordered and patient given Rx for naproxen and baclofen.  Lumbar x-ray showing minimal degenerative changes in the lower lumbar spine with no acute osseous abnormality  Pap smear Pap smear from 2016 within normal limits.  Diabetes Fasting checks: *** Post prandial ***  Compliance: *** Diet: ***  Exercise: *** Eye exam: *** Foot exam: *** A1C: *** Symptoms: *** symptoms of hypoglycemia. *** symptoms of  polyuria, polydipsia. *** numbness in extremities, and *** foot ulcers/trauma Meds: Metformin 1000 mg twice daily Pneumonia vaccine: UTD Urine micro albumin:creatine ratio: Microalbumin creatinine ratio elevated to 69 at last visit.  Patient is on ACE inhibitor, lisinopril 20 mg  Monitoring Labs and Parameters Last A1C:  Lab Results  Component Value Date   HGBA1C 6.5 08/14/2018   Last Lipid:     Component Value Date/Time   CHOL 176 08/14/2018 1653   HDL 72 08/14/2018 1653   Last Bmet  Potassium  Date Value Ref Range Status  08/14/2018 4.3 3.5 - 5.2 mmol/L Final   Sodium  Date Value Ref Range Status  08/14/2018 144 134 - 144 mmol/L Final   Creat  Date Value Ref Range Status  04/13/2015 0.79 0.50 - 1.05 mg/dL Final   Creatinine, Ser  Date Value Ref Range Status  08/14/2018 0.74 0.57 - 1.00 mg/dL Final      Smoking status reviewed  Review of Systems   Objective:  There were no vitals taken for this visit. Vitals and nursing note reviewed  General: well nourished, in no acute distress HEENT: normocephalic, TM's visualized bilaterally, no scleral icterus or conjunctival pallor, no nasal discharge, moist mucous membranes, good dentition without erythema or discharge noted in posterior  oropharynx Neck: supple, non-tender, without lymphadenopathy Cardiac: RRR, clear S1 and S2, no murmurs, rubs, or gallops Respiratory: clear to auscultation bilaterally, no increased work of breathing Abdomen: soft, nontender, nondistended, no masses or organomegaly. Bowel sounds present Extremities: no edema or cyanosis. Warm, well perfused. 2+ radial and PT pulses bilaterally Skin: warm and dry, no rashes noted Neuro: alert and oriented, no focal deficits   Assessment & Plan:    No problem-specific Assessment & Plan notes found for this encounter.    No follow-ups on file.   Caroline More, DO, PGY-3

## 2019-04-01 ENCOUNTER — Ambulatory Visit: Payer: BC Managed Care – PPO | Admitting: Family Medicine

## 2019-04-10 NOTE — Progress Notes (Signed)
Subjective:   [name] is a 58 y.o. female with a history of cervical cancer s/p hysterectomy here for an annual gynecological exam.   Gyn concerns/Preventative healthcare  Last menstrual period: No LMP recorded. Patient has had a hysterectomy.  Regular periods:   Heavy bleeding: no  Sexually active: no  Contraception or hormonal therapy: s/p hysterectomy   Hx of STD: Patient does desire STD screening  Dyspareunia: Not sexually active   Hot flashes: occasionally   Vaginal discharge: no   Dysuria:No   Last mammogram: 02/2019 - negative   Breast mass or concerns: No  Last pap smear: 06/2014 (wnl)   Back pain Patient reports low back pain. Reports a pins and needles feeling in her fingers and toes as well. States pain started 1 month ago. Sometimes she gets numbness in toes as well. Denies weakness or incontinence. Does sometimes feel "off balanace". Denies LOC. Denies fever. Has tried a back brace and baclofen without help.   PMH, Surgical Hx, Family Hx, Social History reviewed and updated as below.  Past Medical History:  Diagnosis Date  . Cervical cancer ([place]) 2007   s/p partial hysterectomy and radiation  . Sickle cell trait [facility])    Past Surgical History:  Procedure Laterality Date  . ABDOMINAL HYSTERECTOMY  2007   for cervical cancer; radical  . JOINT REPLACEMENT     left hip replacement 3-5 years ago   Family History  Problem Relation Age of Onset  . Cancer Mother        breast cancer, died at 38yo  . Breast cancer Mother 71  . Seizures Father   . Diabetes Maternal Aunt   . Diabetes Maternal Uncle   . Diabetes Brother    Social History   Socioeconomic History  . Marital status: Single    Spouse name: Not on file  . Number of children: Not on file  . Years of education: Not on file  . Highest education level: Not on file  Occupational History  . Not on file  Social Needs  . Financial resource strain: Not on file  . Food  insecurity    Worry: Not on file    Inability: Not on file  . Transportation needs    Medical: Not on file    Non-medical: Not on file  Tobacco Use  . Smoking status: Former Smoker    Types: Cigarettes    Quit date: 06/05/1996    Years since quitting: 22.8  . Smokeless tobacco: Never Used  . Tobacco comment: quit 10-15 years ago  Substance and Sexual Activity  . Alcohol use: Yes    Alcohol/week: 2.0 - 4.0 standard drinks    Types: 2 - 4 Cans of beer per week    Comment: 3-4 times a week  . Drug use: Never  . Sexual activity: Not Currently  Lifestyle  . Physical activity    Days per week: Not on file    Minutes per session: Not on file  . Stress: Not on file  Relationships  . Social Herbalist on phone: Not on file    Gets together: Not on file    Attends religious service: Not on file    Active member of club or organization: Not on file    Attends meetings of clubs or organizations: Not on file    Relationship status: Not on file  Other Topics Concern  . Not on file  Social History Narrative  . Not  on file    Review of Systems:  Per HPI. Otherwise a complete 10 point ROS was negative.    Objective:   Vitals:   04/16/19 1516  BP: 126/80  Pulse: 97  SpO2: 97%   Exam: General: well appearing, NAD. HEENT: NCAT. Cardiovascular: RRR. No murmurs, rubs, or gallops. Respiratory: CTAB. No rales, rhonchi, or wheeze. Abdomen: soft, nontender, nondistended. Extremities: warm, well perfused. No LE edema. Back: full ROM Skin: Warm, dry, intact. Neuro: No focal deficits.  Pelvic Exam:        External: normal female genitalia without lesions or masses        Vagina: normal without lesions or masses        Cervix: not present        Pap smear: not performed    Chemistry      Component Value Date/Time   NA 143 04/16/2019 1647   K 4.4 04/16/2019 1647   CL 105 04/16/2019 1647   CO2 22 04/16/2019 1647   BUN 15 04/16/2019 1647   CREATININE 0.78 04/16/2019  1647   CREATININE 0.79 04/13/2015 1610      Component Value Date/Time   CALCIUM 9.8 04/16/2019 1647   ALKPHOS 59 08/14/2018 1653   AST 18 08/14/2018 1653   ALT 21 08/14/2018 1653   BILITOT <0.2 08/14/2018 1653      Lab Results  Component Value Date   WBC 5.0 04/16/2019   HGB 12.6 04/16/2019   HCT 39.1 04/16/2019   MCV 91 04/16/2019   PLT 276 04/16/2019   Lab Results  Component Value Date   TSH 2.550 08/14/2018   Lab Results  Component Value Date   HGBA1C 6.4 04/16/2019   Assessment/Plan:   Low back pain Recent lumbar spine showing degeneration. Advised PT as well as voltaren gel and ibuprofen as needed. Strict return precautions given. Follow up if no improvement.   Diabetic neuropathy (HCC) Tingling in fingers and toes likely 2/2 diabetic neuropathy. A1C 6.4 and patient is not compliant with diet and exercise recommendations. Discussed lifestyle modifications but patient states she would rather start another medication. Will start jardiance as this will have cardiac and renal benefits as well. Strict return precautions given. Follow up in 1 month. Continue to encourage healthy diet and daily exercise. Will also obtain BMP, CBC, B12 and folate to rule out other causes of neuropathy.   Malignant neoplasm of cervix uteri [place]) S/p radical hysterectomy. Patient was not aware she did not have a cervix. I explained this to her. Advised to vaginal pap smears be obtained by gyn-onc. Last note from 4 years ago advising yearly follow up. Referral placed.   Healthcare maintenance Referral for colonoscopy placed    Return in about 4 weeks (around 05/14/2019), or if symptoms worsen or fail to improve.   Caroline More, DO, PGY-3

## 2019-04-16 ENCOUNTER — Ambulatory Visit (INDEPENDENT_AMBULATORY_CARE_PROVIDER_SITE_OTHER): Payer: Self-pay | Admitting: Family Medicine

## 2019-04-16 ENCOUNTER — Encounter: Payer: Self-pay | Admitting: Family Medicine

## 2019-04-16 ENCOUNTER — Other Ambulatory Visit: Payer: Self-pay

## 2019-04-16 VITALS — BP 126/80 | HR 97 | Wt 279.2 lb

## 2019-04-16 DIAGNOSIS — Z124 Encounter for screening for malignant neoplasm of cervix: Secondary | ICD-10-CM | POA: Diagnosis not present

## 2019-04-16 DIAGNOSIS — Z202 Contact with and (suspected) exposure to infections with a predominantly sexual mode of transmission: Secondary | ICD-10-CM

## 2019-04-16 DIAGNOSIS — M545 Low back pain, unspecified: Secondary | ICD-10-CM

## 2019-04-16 DIAGNOSIS — C539 Malignant neoplasm of cervix uteri, unspecified: Secondary | ICD-10-CM

## 2019-04-16 DIAGNOSIS — Z8541 Personal history of malignant neoplasm of cervix uteri: Secondary | ICD-10-CM

## 2019-04-16 DIAGNOSIS — Z0000 Encounter for general adult medical examination without abnormal findings: Secondary | ICD-10-CM

## 2019-04-16 DIAGNOSIS — Z1211 Encounter for screening for malignant neoplasm of colon: Secondary | ICD-10-CM | POA: Diagnosis not present

## 2019-04-16 DIAGNOSIS — G629 Polyneuropathy, unspecified: Secondary | ICD-10-CM

## 2019-04-16 DIAGNOSIS — M5136 Other intervertebral disc degeneration, lumbar region: Secondary | ICD-10-CM

## 2019-04-16 DIAGNOSIS — E1149 Type 2 diabetes mellitus with other diabetic neurological complication: Secondary | ICD-10-CM

## 2019-04-16 DIAGNOSIS — R7303 Prediabetes: Secondary | ICD-10-CM

## 2019-04-16 LAB — POCT GLYCOSYLATED HEMOGLOBIN (HGB A1C): HbA1c, POC (controlled diabetic range): 6.4 % (ref 0.0–7.0)

## 2019-04-16 MED ORDER — EMPAGLIFLOZIN 10 MG PO TABS: 10.0000 mg | ORAL_TABLET | Freq: Every day | ORAL | 0 refills | Status: DC

## 2019-04-16 MED ORDER — DICLOFENAC SODIUM 1 % EX GEL: 4.0000 g | Freq: Four times a day (QID) | CUTANEOUS | 1 refills | Status: DC | PRN

## 2019-04-16 NOTE — Patient Instructions (Signed)

## 2019-04-17 ENCOUNTER — Telehealth: Payer: Self-pay | Admitting: *Deleted

## 2019-04-17 ENCOUNTER — Encounter: Payer: Self-pay | Admitting: Family Medicine

## 2019-04-17 DIAGNOSIS — E1140 Type 2 diabetes mellitus with diabetic neuropathy, unspecified: Secondary | ICD-10-CM | POA: Insufficient documentation

## 2019-04-17 DIAGNOSIS — Z0000 Encounter for general adult medical examination without abnormal findings: Secondary | ICD-10-CM | POA: Insufficient documentation

## 2019-04-17 LAB — FOLATE: Folate: 5.7 ng/mL (ref >3.0)

## 2019-04-17 LAB — BASIC METABOLIC PANEL
BUN/Creatinine Ratio: 19 (ref 9–23)
BUN: 15 mg/dL (ref 6–24)
CO2: 22 mmol/L (ref 20–29)
Calcium: 9.8 mg/dL (ref 8.7–10.2)
Chloride: 105 mmol/L (ref 96–106)
Creatinine, Ser: 0.78 mg/dL (ref 0.57–1.00)
GFR calc Af Amer: 96 mL/min/{1.73_m2} (ref >59)
GFR calc non Af Amer: 83 mL/min/{1.73_m2} (ref >59)
Glucose: 109 mg/dL — ABNORMAL HIGH (ref 65–99)
Potassium: 4.4 mmol/L (ref 3.5–5.2)
Sodium: 143 mmol/L (ref 134–144)

## 2019-04-17 LAB — CBC
Hematocrit: 39.1 % (ref 34.0–46.6)
Hemoglobin: 12.6 g/dL (ref 11.1–15.9)
MCH: 29.2 pg (ref 26.6–33.0)
MCHC: 32.2 g/dL (ref 31.5–35.7)
MCV: 91 fL (ref 79–97)
Platelets: 276 10*3/uL (ref 150–450)
RBC: 4.32 x10E6/uL (ref 3.77–5.28)
RDW: 13.6 % (ref 11.7–15.4)
WBC: 5 10*3/uL (ref 3.4–10.8)

## 2019-04-17 LAB — VITAMIN B12: Vitamin B-12: 400 pg/mL (ref 232–1245)

## 2019-04-17 NOTE — Assessment & Plan Note (Signed)
Referral for colonoscopy placed. ?

## 2019-04-17 NOTE — Telephone Encounter (Signed)
-----   Message from Melinda More, DO sent at 04/17/2019  8:35 AM EST ----- Please inform patient that results of BMP, CBC, B12, and folate are negative.

## 2019-04-17 NOTE — Assessment & Plan Note (Signed)
Recent lumbar spine showing degeneration. Advised PT as well as voltaren gel and ibuprofen as needed. Strict return precautions given. Follow up if no improvement.

## 2019-04-17 NOTE — Assessment & Plan Note (Signed)
S/p radical hysterectomy. Patient was not aware she did not have a cervix. I explained this to her. Advised to vaginal pap smears be obtained by gyn-onc. Last note from 4 years ago advising yearly follow up. Referral placed.

## 2019-04-17 NOTE — Assessment & Plan Note (Signed)
Tingling in fingers and toes likely 2/2 diabetic neuropathy. A1C 6.4 and patient is not compliant with diet and exercise recommendations. Discussed lifestyle modifications but patient states she would rather start another medication. Will start jardiance as this will have cardiac and renal benefits as well. Strict return precautions given. Follow up in 1 month. Continue to encourage healthy diet and daily exercise. Will also obtain BMP, CBC, B12 and folate to rule out other causes of neuropathy.

## 2019-04-17 NOTE — Telephone Encounter (Signed)
LMOVM informing pt of normal results. Deseree Blount, CMA  

## 2019-04-18 ENCOUNTER — Telehealth: Payer: Self-pay | Admitting: *Deleted

## 2019-04-18 NOTE — Telephone Encounter (Signed)
Informed patient of OTC med which she had already picked up.  [name], [place]

## 2019-04-18 NOTE — Telephone Encounter (Signed)
Received fax from pharmacy, PA needed on Voltaren gel.  Clinical questions submitted via Cover My Meds.  Waiting on response, could take up to 72 hours.  Cover My Meds info: Key: Chrys Racer, CMA

## 2019-04-18 NOTE — Telephone Encounter (Signed)
Please inform patient she can get this med [place], DO, PGY-3 [facility] 04/18/2019 10:36 AM

## 2019-05-29 ENCOUNTER — Encounter: Payer: Self-pay | Admitting: Family Medicine

## 2019-07-16 ENCOUNTER — Ambulatory Visit (INDEPENDENT_AMBULATORY_CARE_PROVIDER_SITE_OTHER)
Payer: No Typology Code available for payment source | Admitting: Student in an Organized Health Care Education/Training Program

## 2019-07-16 ENCOUNTER — Encounter: Payer: Self-pay | Admitting: Student in an Organized Health Care Education/Training Program

## 2019-07-16 ENCOUNTER — Other Ambulatory Visit: Payer: Self-pay

## 2019-07-16 VITALS — BP 136/94 | HR 90 | Wt 269.0 lb

## 2019-07-16 DIAGNOSIS — G629 Polyneuropathy, unspecified: Secondary | ICD-10-CM | POA: Insufficient documentation

## 2019-07-16 DIAGNOSIS — R102 Pelvic and perineal pain: Secondary | ICD-10-CM

## 2019-07-16 DIAGNOSIS — E118 Type 2 diabetes mellitus with unspecified complications: Secondary | ICD-10-CM | POA: Diagnosis not present

## 2019-07-16 DIAGNOSIS — Z1211 Encounter for screening for malignant neoplasm of colon: Secondary | ICD-10-CM | POA: Diagnosis not present

## 2019-07-16 DIAGNOSIS — G63 Polyneuropathy in diseases classified elsewhere: Secondary | ICD-10-CM

## 2019-07-16 LAB — POCT UA - MICROSCOPIC ONLY

## 2019-07-16 LAB — POCT URINALYSIS DIP (MANUAL ENTRY)
Bilirubin, UA: NEGATIVE
Glucose, UA: 500 mg/dL — AB
Ketones, POC UA: NEGATIVE mg/dL
Leukocytes, UA: NEGATIVE
Nitrite, UA: NEGATIVE
Protein Ur, POC: NEGATIVE mg/dL
Spec Grav, UA: 1.02 (ref 1.010–1.025)
Urobilinogen, UA: 0.2 E.U./dL
pH, UA: 6 (ref 5.0–8.0)

## 2019-07-16 MED ORDER — GABAPENTIN 300 MG PO CAPS: 300.0000 mg | ORAL_CAPSULE | Freq: Every day | ORAL | 0 refills | Status: DC

## 2019-07-16 NOTE — Patient Instructions (Signed)
It was a pleasure to see you today!  To summarize our discussion for this visit:  [facility] tried to contact you to set up an appointment for colonoscopy. You can contact them directly at this number: [phone]. I will send in a new referral as the previous one was closed.   We are checking your urine today to see if that can be contributing to your pain. We will contact you with the results.  Some additional health maintenance measures we should update are: Health Maintenance Due  Topic Date Due  . OPHTHALMOLOGY EXAM  04/22/1969  . COLONOSCOPY  04/22/2009  . PAP SMEAR-Modifier  06/22/2017  .    Please return to our clinic to see [place] as needed.  Call the clinic at [phone] if your symptoms worsen or you have any concerns.   Thank you for allowing me to take part in your care,  Dr. Doristine Mango

## 2019-07-16 NOTE — Progress Notes (Signed)
   CHIEF COMPLAINT / HPI: abdominal pain  Patient endorses 7 out of 10 abdominal pain present for the last 2 months.  Pain is present at all times throughout the day and night and does not wax or wane.  She has not noticed any events that make the pain better or worse.  Denies any changes in bowel habits including diarrhea, constipation, blood in stool, bloating, nausea vomiting.  denies vaginal bleeding, night sweats. Has history of hysterectomy.  Endorses polyuria with urgency and frequency without dysuria.  Has seen 2 episodes of red urine which hs resolved. Patient drinks approximately 12 bottles of water per day as she just really loves water.  Chart review reveals approximately 30 pound weight loss in the last year which patient states was unintentional but she has been more active at work and thinks that this may have been contributing to that weight loss.  Patient experiencing bilateral hand tingling/numbness which seems to be worse at night.  PERTINENT  PMH / PSH: history of metastatic cervical cancer with radiation 2000 s/p radical hysterectomy and prior suspicion and work up for bladder cancer 2015.   OBJECTIVE: BP (!) 136/94   Pulse 90   Wt 269 lb (122 kg)   SpO2 98%   BMI 42.13 kg/m   General: NAD, pleasant, able to participate in exam Cardiac: RRR, normal heart sounds, no murmurs. 2+ radial and PT pulses bilaterally Respiratory: CTAB, normal effort, No wheezes, rales or rhonchi Abdomen: soft, nondistended, no hepatic or splenomegaly, hypoactive BS, mild tenderness to palpation of suprapubic region and epigastric region with deep palpation. Vertical, midline surgical scar from hysterectomy. Negative for CVA tenderness Extremities: no edema or cyanosis. WWP. Skin: warm and dry, no rashes noted Neuro: alert and oriented x4, no focal deficits Psych: Normal affect and mood   ASSESSMENT / PLAN:  Pelvic pain Concern for pathology increased due to patient's  surgical/cancer/radiation history to this area. Given her urinary symptoms and benign exam, will obtain urine today to evaluate for UTI. Consider referral to gyn/onc for further workup if not improved. Would consider abdominal CT/PET? Sent in new referral to GI for colonoscopy but not experiencing any bowel habit changes. Also provided patient with practice contact info so she can reach out to schedule.  Peripheral neuropathy Long history of neuropathy. DM appears to be moderately well controlled per recent A1c. Not due for recheck but her current symptoms could be explained by poorly controlled DM.  Will prescribe 300mg  gabapentin QHS     Richarda Osmond, [place]

## 2019-07-16 NOTE — Assessment & Plan Note (Signed)
Concern for pathology increased due to patient's surgical/cancer/radiation history to this area. Given her urinary symptoms and benign exam, will obtain urine today to evaluate for UTI. Consider referral to gyn/onc for further workup if not improved. Would consider abdominal CT/PET? Sent in new referral to GI for colonoscopy but not experiencing any bowel habit changes. Also provided patient with practice contact info so she can reach out to schedule.

## 2019-07-16 NOTE — Assessment & Plan Note (Signed)
Long history of neuropathy. DM appears to be moderately well controlled per recent A1c. Not due for recheck but her current symptoms could be explained by poorly controlled DM.  Will prescribe 300mg  gabapentin QHS

## 2019-07-17 ENCOUNTER — Encounter: Payer: Self-pay | Admitting: Gastroenterology

## 2019-08-05 ENCOUNTER — Encounter: Payer: No Typology Code available for payment source | Admitting: Internal Medicine

## 2019-08-05 ENCOUNTER — Other Ambulatory Visit: Payer: Self-pay

## 2019-08-05 ENCOUNTER — Ambulatory Visit (AMBULATORY_SURGERY_CENTER): Payer: Self-pay

## 2019-08-05 VITALS — Temp 97.8°F | Ht 70.0 in | Wt 266.0 lb

## 2019-08-05 DIAGNOSIS — Z1211 Encounter for screening for malignant neoplasm of colon: Secondary | ICD-10-CM

## 2019-08-05 MED ORDER — NA SULFATE-K SULFATE-MG SULF 17.5-3.13-1.6 GM/177ML PO SOLN: 1.0000 | Freq: Once | ORAL | 0 refills | Status: AC

## 2019-08-05 NOTE — Progress Notes (Signed)

## 2019-08-18 ENCOUNTER — Other Ambulatory Visit: Payer: Self-pay

## 2019-08-18 ENCOUNTER — Encounter: Payer: Self-pay | Admitting: Gastroenterology

## 2019-08-18 DIAGNOSIS — I10 Essential (primary) hypertension: Secondary | ICD-10-CM

## 2019-08-18 DIAGNOSIS — E119 Type 2 diabetes mellitus without complications: Secondary | ICD-10-CM

## 2019-08-18 MED ORDER — AMLODIPINE BESYLATE 10 MG PO TABS: 10.0000 mg | ORAL_TABLET | Freq: Every day | ORAL | 3 refills | Status: DC

## 2019-08-18 MED ORDER — PRAVASTATIN SODIUM 40 MG PO TABS: 40.0000 mg | ORAL_TABLET | Freq: Every day | ORAL | 3 refills | Status: DC

## 2019-08-18 MED ORDER — METFORMIN HCL 1000 MG PO TABS: 1000.0000 mg | ORAL_TABLET | Freq: Two times a day (BID) | ORAL | 3 refills | Status: DC

## 2019-08-19 ENCOUNTER — Other Ambulatory Visit: Payer: Self-pay

## 2019-08-19 ENCOUNTER — Encounter: Payer: Self-pay | Admitting: Gastroenterology

## 2019-08-19 ENCOUNTER — Ambulatory Visit (AMBULATORY_SURGERY_CENTER): Payer: No Typology Code available for payment source | Admitting: Gastroenterology

## 2019-08-19 VITALS — BP 152/81 | HR 72 | Temp 97.3°F | Resp 14 | Ht 70.0 in | Wt 266.0 lb

## 2019-08-19 DIAGNOSIS — Z1211 Encounter for screening for malignant neoplasm of colon: Secondary | ICD-10-CM | POA: Diagnosis present

## 2019-08-19 DIAGNOSIS — D124 Benign neoplasm of descending colon: Secondary | ICD-10-CM

## 2019-08-19 DIAGNOSIS — D122 Benign neoplasm of ascending colon: Secondary | ICD-10-CM | POA: Diagnosis not present

## 2019-08-19 DIAGNOSIS — D123 Benign neoplasm of transverse colon: Secondary | ICD-10-CM

## 2019-08-19 MED ORDER — SODIUM CHLORIDE 0.9 % IV SOLN: 500.0000 mL | Freq: Once | INTRAVENOUS | Status: DC

## 2019-08-19 NOTE — Op Note (Signed)
[facility] Patient Name: [name] Procedure Date: 08/19/2019 9:44 AM MRN: [id number] Endoscopist: Mauri Pole , MD Age: 59 Referring MD:  Date of Birth: July 08, 1960 Gender: Female Account #: [id number] Procedure:                Colonoscopy Indications:              Screening for colorectal malignant neoplasm Medicines:                Monitored Anesthesia Care Procedure:                Pre-Anesthesia Assessment:                           - Prior to the procedure, a History and Physical                            was performed, and patient medications and                            allergies were reviewed. The patient's tolerance of                            previous anesthesia was also reviewed. The risks                            and benefits of the procedure and the sedation                            options and risks were discussed with the patient.                            All questions were answered, and informed consent                            was obtained. Prior Anticoagulants: The patient has                            taken no previous anticoagulant or antiplatelet                            agents. ASA Grade Assessment: II - A patient with                            mild systemic disease. After reviewing the risks                            and benefits, the patient was deemed in                            satisfactory condition to undergo the procedure.                           After obtaining informed consent, the colonoscope  was passed under direct vision. Throughout the                            procedure, the patient's blood pressure, pulse, and                            oxygen saturations were monitored continuously. The                            Endoscope was introduced through the anus and                            advanced to the the cecum, identified by                            appendiceal orifice and  ileocecal valve. The                            colonoscopy was technically difficult and complex                            due to bowel stenosis, restricted mobility of the                            colon and the patient's body habitus. Successful                            completion of the procedure was aided by                            withdrawing the scope and replacing with the adult                            endoscope and applying abdominal pressure. The                            patient tolerated the procedure well. The quality                            of the bowel preparation was adequate. The                            ileocecal valve, appendiceal orifice, and rectum                            were photographed. Scope In: 10:10:46 AM Scope Out: 10:33:40 AM Scope Withdrawal Time: 0 hours 16 minutes 29 seconds  Total Procedure Duration: 0 hours 22 minutes 54 seconds  Findings:                 The perianal and digital rectal examinations were                            normal.  A 3 mm polyp was found in the ascending colon. The                            polyp was sessile. The polyp was removed with a                            cold biopsy forceps. Resection and retrieval were                            complete.                           Four sessile polyps were found in the descending                            colon and transverse colon. The polyps were 5 to 9                            mm in size. These polyps were removed with a cold                            snare. Resection and retrieval were complete.                           A 18 mm polyp was found in the distal transverse                            colon. The polyp was pedunculated. The polyp was                            removed with a hot snare. Resection and retrieval                            were complete.                           Multiple small and large-mouthed diverticula  were                            found in the sigmoid colon and descending colon.                           Non-bleeding internal hemorrhoids were found during                            retroflexion. The hemorrhoids were small. Complications:            No immediate complications. Estimated Blood Loss:     Estimated blood loss was minimal. Impression:               - One 3 mm polyp in the ascending colon, removed                            with a cold biopsy  forceps. Resected and retrieved.                           - Four 5 to 9 mm polyps in the descending colon and                            in the transverse colon, removed with a cold snare.                            Resected and retrieved.                           - One 18 mm polyp in the distal transverse colon,                            removed with a hot snare. Resected and retrieved.                           - Diverticulosis in the sigmoid colon and in the                            descending colon.                           - Non-bleeding internal hemorrhoids. Recommendation:           - Patient has a contact number available for                            emergencies. The signs and symptoms of potential                            delayed complications were discussed with the                            patient. Return to normal activities tomorrow.                            Written discharge instructions were provided to the                            patient.                           - Resume previous diet.                           - Continue present medications.                           - Await pathology results.                           - Repeat colonoscopy in 3 years for surveillance  based on pathology results. To be scheduled at [facility]                            endoscopy unit , wil need Ultraslim colonoscope Mauri Pole, MD 08/19/2019 10:42:22 AM This report has been signed  electronically.

## 2019-08-19 NOTE — Progress Notes (Signed)
PT taken to PACU. Monitors in place. VSS. Report given to RN. 

## 2019-08-19 NOTE — Progress Notes (Signed)
Temp by LC vitals by CW   Pt's states no medical or surgical changes since previsit or office visit.

## 2019-08-19 NOTE — Patient Instructions (Signed)
Discharge instructions given. Handouts on polyps,diverticulosis and Hemorrhoids. No aspirin,ibuprofen,naproxen or other non-steroidal anti-inflammatory drugs for 2 weeks. Resume previous medications. YOU HAD AN ENDOSCOPIC PROCEDURE TODAY AT [facility]:   Refer to the procedure report that was given to you for any specific questions about what was found during the examination.  If the procedure report does not answer your questions, please call your gastroenterologist to clarify.  If you requested that your care partner not be given the details of your procedure findings, then the procedure report has been included in a sealed envelope for you to review at your convenience later.  YOU SHOULD EXPECT: Some feelings of bloating in the abdomen. Passage of more gas than usual.  Walking can help get rid of the air that was put into your GI tract during the procedure and reduce the bloating. If you had a lower endoscopy (such as a colonoscopy or flexible sigmoidoscopy) you may notice spotting of blood in your stool or on the toilet paper. If you underwent a bowel prep for your procedure, you may not have a normal bowel movement for a few days.  Please Note:  You might notice some irritation and congestion in your nose or some drainage.  This is from the oxygen used during your procedure.  There is no need for concern and it should clear up in a day or so.  SYMPTOMS TO REPORT IMMEDIATELY:   Following lower endoscopy (colonoscopy or flexible sigmoidoscopy):  Excessive amounts of blood in the stool  Significant tenderness or worsening of abdominal pains  Swelling of the abdomen that is new, acute  Fever of 100F or higher  For urgent or emergent issues, a gastroenterologist can be reached at any hour by calling [phone]. Do not use MyChart messaging for urgent concerns.    DIET:  We do recommend a small meal at first, but then you may proceed to your regular diet.  Drink plenty of  fluids but you should avoid alcoholic beverages for 24 hours.  ACTIVITY:  You should plan to take it easy for the rest of today and you should NOT DRIVE or use heavy machinery until tomorrow (because of the sedation medicines used during the test).    FOLLOW UP: Our staff will call the number listed on your records 48-72 hours following your procedure to check on you and address any questions or concerns that you may have regarding the information given to you following your procedure. If we do not reach you, we will leave a message.  We will attempt to reach you two times.  During this call, we will ask if you have developed any symptoms of COVID 19. If you develop any symptoms (ie: fever, flu-like symptoms, shortness of breath, cough etc.) before then, please call [phone].  If you test positive for Covid 19 in the 2 weeks post procedure, please call and report this information to [place].    If any biopsies were taken you will be contacted by phone or by letter within the next 1-3 weeks.  Please call us at [phone] if you have not heard about the biopsies in 3 weeks.    SIGNATURES/CONFIDENTIALITY: You and/or your care partner have signed paperwork which will be entered into your electronic medical record.  These signatures attest to the fact that that the information above on your After Visit Summary has been reviewed and is understood.  Full responsibility of the confidentiality of this discharge information lies with you and/or  your care-partner. 

## 2019-08-19 NOTE — Progress Notes (Signed)
Called to room to assist during endoscopic procedure.  Patient ID and intended procedure confirmed with present staff. Received instructions for my participation in the procedure from the performing physician.  

## 2019-08-21 ENCOUNTER — Telehealth: Payer: Self-pay | Admitting: *Deleted

## 2019-08-21 NOTE — Telephone Encounter (Signed)
  Follow up Call-  Call back number 08/19/2019  Post procedure Call Back phone  # [phone]  Permission to leave phone message Yes  Some recent data might be hidden     Patient questions:  Do you have a fever, pain , or abdominal swelling? No. Pain Score  0 *  Have you tolerated food without any problems? Yes.    Have you been able to return to your normal activities? Yes.    Do you have any questions about your discharge instructions: Diet   No. Medications  No. Follow up visit  No.  Do you have questions or concerns about your Care? No.  Actions: * If pain score is 4 or above: No action needed, pain <4.  1. Have you developed a fever since your procedure? no  2.   Have you had an respiratory symptoms (SOB or cough) since your procedure? no  3.   Have you tested positive for COVID 19 since your procedure no  4.   Have you had any family members/close contacts diagnosed with the COVID 19 since your procedure?  no   If yes to any of these questions please route to Joylene John, RN and Alphonsa Gin, Therapist, sports.

## 2019-08-26 ENCOUNTER — Encounter: Payer: Self-pay | Admitting: Gastroenterology

## 2019-10-08 ENCOUNTER — Ambulatory Visit (INDEPENDENT_AMBULATORY_CARE_PROVIDER_SITE_OTHER): Payer: No Typology Code available for payment source | Admitting: Family Medicine

## 2019-10-08 ENCOUNTER — Encounter: Payer: Self-pay | Admitting: Family Medicine

## 2019-10-08 ENCOUNTER — Other Ambulatory Visit: Payer: Self-pay

## 2019-10-08 VITALS — HR 83 | Ht 70.0 in | Wt 264.4 lb

## 2019-10-08 DIAGNOSIS — R1013 Epigastric pain: Secondary | ICD-10-CM | POA: Diagnosis not present

## 2019-10-08 DIAGNOSIS — R109 Unspecified abdominal pain: Secondary | ICD-10-CM

## 2019-10-08 DIAGNOSIS — G6289 Other specified polyneuropathies: Secondary | ICD-10-CM | POA: Diagnosis not present

## 2019-10-08 DIAGNOSIS — M545 Low back pain, unspecified: Secondary | ICD-10-CM

## 2019-10-08 LAB — POCT UA - MICROSCOPIC ONLY

## 2019-10-08 LAB — POCT GLYCOSYLATED HEMOGLOBIN (HGB A1C): HbA1c, POC (controlled diabetic range): 6 % (ref 0.0–7.0)

## 2019-10-08 LAB — POCT URINALYSIS DIP (MANUAL ENTRY)
Bilirubin, UA: NEGATIVE
Glucose, UA: NEGATIVE mg/dL
Ketones, POC UA: NEGATIVE mg/dL
Leukocytes, UA: NEGATIVE
Nitrite, UA: NEGATIVE
Protein Ur, POC: NEGATIVE mg/dL
Spec Grav, UA: 1.015 (ref 1.010–1.025)
Urobilinogen, UA: 0.2 E.U./dL
pH, UA: 5.5 (ref 5.0–8.0)

## 2019-10-08 MED ORDER — DICLOFENAC SODIUM 1 % EX GEL: 2.0000 g | Freq: Four times a day (QID) | CUTANEOUS | 0 refills | Status: DC

## 2019-10-08 NOTE — Progress Notes (Signed)
SUBJECTIVE:   CHIEF COMPLAINT / HPI:   Back/abdominal pain  Patient presents to clinic for low back pain as well as abdominal pain.  States that both have been present for the past month.  Does note increased urinary frequency but no dysuria.  No hematuria as well.  Denies any constipation or vaginal bleeding.  Does think she may have some vaginal discharge.  Denies any nausea, vomiting, diarrhea.  Denies any fevers.  Pain is constant moving her low back as well as her lower abdomen.  Pain is exacerbated with standing especially at work.  Pain is also worsened with position changes.  Denies any food making the pain worse.  Does feel bloated.  Does note urinary incontinence, wears pads.  States she cannot get to the bathroom fast enough.  Finger numbness Patient does note finger numbness at the tips of her fingers.  It is a burning sensation as well.  Has been taking gabapentin but is not helping.  Symptoms have been present x1 month.  He is using her Jardiance and Metformin for diabetes.  Reports that her diet is "all right".  Is limiting her sweets and her carbohydrates.  Is exercising daily with walking and trying to lose weight.  She is also trying to limit her soda intake.  Elevated PHQ2 Patient on presentation had elevated PHQ 2.  Reflex PHQ-9 was obtained which is elevated.  Patient reports she does not feel depressed but her symptoms are mainly from her abdominal and back pain.  She would not like to see a therapist or get medication at this time.  Depression screen [facility] 2/9 10/09/2019 10/08/2019 07/16/2019 10/04/2016 04/13/2015  Decreased Interest 2 2 0 0 0  Down, Depressed, Hopeless 0 0 0 0 0  PHQ - 2 Score 2 2 0 0 0  Altered sleeping 2 - - - -  Tired, decreased energy 2 - - - -  Change in appetite 0 - - - -  Feeling bad or failure about yourself  0 - - - -  Trouble concentrating 0 - - - -  Moving slowly or fidgety/restless 0 - - - -  Suicidal thoughts 0 - - - -  PHQ-9 Score 6 - - - -    Difficult doing work/chores Not difficult at all - - - -    PERTINENT  PMH / PSH: Type 2 diabetes, malignant neoplasm of cervix, primary hypertension  OBJECTIVE:   Pulse 83   Ht 5\' 10"  (1.778 m)   Wt 264 lb 6.4 oz (119.9 kg)   BMI 37.94 kg/m   General: Awake and alert, no apparent distress HEENT: Moist mucous membranes Cardio: Regular rate and rhythm, no murmurs rubs or gallops Respiratory: Clear to auscultation bilaterally, no increased work of breathing GI: Soft, nontender, nondistended, bowel sounds present Extremities: No edema  ASSESSMENT/PLAN:   Low back pain Pain likely musculoskeletal.  Advised Voltaren gel and heating pads.  Advised continuing exercises.  Follow-up if no improvement.  Abdominal pain Unclear etiology at this time.  Abdominal exam showing no distinct tenderness.  Patient does have a 20 pound weight loss which is somewhat concerning.  Will obtain CBC with differential, CMP, lipase to help discern the cause.  Advised to follow-up with to ensure improvement.  UA not show any signs of UTI.  Peripheral neuropathy Unclear etiology but only in fingers.  Has had previous work-up including CBC as well as B12 and folate.  All of which was negative.  Given continued  symptoms will refer to neurology.  Would benefit from EMG.  Follow-up with [facility] as needed.     Caroline More, [place]

## 2019-10-08 NOTE — Patient Instructions (Signed)
1.  For your finger numbness, I have referred you to a neurologist for work-up.  We will also get an A1c today. 2.  For your back pain, I think it is related to your muscles.  Please use Voltaren gel as well as capsaicin cream, this can be found over-the-counter.  Follow-up in 1 month.  Can use heating pads as needed. 3.  For your incontinence, please keep a bladder diary.  Please also do Kegel exercises.  Follow-up in 1 month. 4.  For your abdominal pain, I will obtain lab work.  Is follow-up in 1 month, sooner if no improvement   Kegel Exercises  Kegel exercises can help strengthen your pelvic floor muscles. The pelvic floor is a group of muscles that support your rectum, small intestine, and bladder. In females, pelvic floor muscles also help support the womb (uterus). These muscles help you control the flow of urine and stool. Kegel exercises are painless and simple, and they do not require any equipment. Your provider may suggest Kegel exercises to:  Improve bladder and bowel control.  Improve sexual response.  Improve weak pelvic floor muscles after surgery to remove the uterus (hysterectomy) or pregnancy (females).  Improve weak pelvic floor muscles after prostate gland removal or surgery (males). Kegel exercises involve squeezing your pelvic floor muscles, which are the same muscles you squeeze when you try to stop the flow of urine or keep from passing gas. The exercises can be done while sitting, standing, or lying down, but it is best to vary your position. Exercises How to do Kegel exercises: 1. Squeeze your pelvic floor muscles tight. You should feel a tight lift in your rectal area. If you are a female, you should also feel a tightness in your vaginal area. Keep your stomach, buttocks, and legs relaxed. 2. Hold the muscles tight for up to 10 seconds. 3. Breathe normally. 4. Relax your muscles. 5. Repeat as told by your health care provider. Repeat this exercise daily as told  by your health care provider. Continue to do this exercise for at least 4-6 weeks, or for as long as told by your health care provider. You may be referred to a physical therapist who can help you learn more about how to do Kegel exercises. Depending on your condition, your health care provider may recommend:  Varying how long you squeeze your muscles.  Doing several sets of exercises every day.  Doing exercises for several weeks.  Making Kegel exercises a part of your regular exercise routine. This information is not intended to replace advice given to you by your health care provider. Make sure you discuss any questions you have with your health care provider. Document Revised: 01/09/2018 Document Reviewed: 01/09/2018 Elsevier Patient Education  [place].

## 2019-10-09 ENCOUNTER — Telehealth: Payer: Self-pay | Admitting: Family Medicine

## 2019-10-09 DIAGNOSIS — R109 Unspecified abdominal pain: Secondary | ICD-10-CM | POA: Insufficient documentation

## 2019-10-09 LAB — CBC WITH DIFFERENTIAL/PLATELET
Basophils Absolute: 0 10*3/uL (ref 0.0–0.2)
Basos: 1 %
EOS (ABSOLUTE): 0 10*3/uL (ref 0.0–0.4)
Eos: 1 %
Hematocrit: 35.7 % (ref 34.0–46.6)
Hemoglobin: 11.8 g/dL (ref 11.1–15.9)
Immature Grans (Abs): 0 10*3/uL (ref 0.0–0.1)
Immature Granulocytes: 0 %
Lymphocytes Absolute: 1.5 10*3/uL (ref 0.7–3.1)
Lymphs: 39 %
MCH: 29.5 pg (ref 26.6–33.0)
MCHC: 33.1 g/dL (ref 31.5–35.7)
MCV: 89 fL (ref 79–97)
Monocytes Absolute: 0.3 10*3/uL (ref 0.1–0.9)
Monocytes: 9 %
Neutrophils Absolute: 1.9 10*3/uL (ref 1.4–7.0)
Neutrophils: 50 %
Platelets: 241 10*3/uL (ref 150–450)
RBC: 4 x10E6/uL (ref 3.77–5.28)
RDW: 14.5 % (ref 11.7–15.4)
WBC: 3.8 10*3/uL (ref 3.4–10.8)

## 2019-10-09 LAB — COMPREHENSIVE METABOLIC PANEL
ALT: 20 IU/L (ref 0–32)
AST: 18 IU/L (ref 0–40)
Albumin/Globulin Ratio: 1.6 (ref 1.2–2.2)
Albumin: 4.2 g/dL (ref 3.8–4.9)
Alkaline Phosphatase: 75 IU/L (ref 39–117)
BUN/Creatinine Ratio: 19 (ref 12–28)
BUN: 12 mg/dL (ref 8–27)
Bilirubin Total: 0.3 mg/dL (ref 0.0–1.2)
CO2: 21 mmol/L (ref 20–29)
Calcium: 9.7 mg/dL (ref 8.7–10.3)
Chloride: 107 mmol/L — ABNORMAL HIGH (ref 96–106)
Creatinine, Ser: 0.64 mg/dL (ref 0.57–1.00)
GFR calc Af Amer: 112 mL/min/{1.73_m2} (ref >59)
GFR calc non Af Amer: 97 mL/min/{1.73_m2} (ref >59)
Globulin, Total: 2.6 g/dL (ref 1.5–4.5)
Glucose: 107 mg/dL — ABNORMAL HIGH (ref 65–99)
Potassium: 4.4 mmol/L (ref 3.5–5.2)
Sodium: 142 mmol/L (ref 134–144)
Total Protein: 6.8 g/dL (ref 6.0–8.5)

## 2019-10-09 LAB — LIPASE: Lipase: 61 U/L (ref 14–72)

## 2019-10-09 NOTE — Assessment & Plan Note (Signed)
Unclear etiology at this time.  Abdominal exam showing no distinct tenderness.  Patient does have a 20 pound weight loss which is somewhat concerning.  Will obtain CBC with differential, CMP, lipase to help discern the cause.  Advised to follow-up with to ensure improvement.  UA not show any signs of UTI.

## 2019-10-09 NOTE — Assessment & Plan Note (Signed)
Unclear etiology but only in fingers.  Has had previous work-up including CBC as well as B12 and folate.  All of which was negative.  Given continued symptoms will refer to neurology.  Would benefit from EMG.  Follow-up with [facility] as needed.

## 2019-10-09 NOTE — Assessment & Plan Note (Signed)
Pain likely musculoskeletal.  Advised Voltaren gel and heating pads.  Advised continuing exercises.  Follow-up if no improvement.

## 2019-10-09 NOTE — Telephone Encounter (Signed)
Patient is returning a missed call from our office. She thinks it was someone calling concerning the labs she had done yesterday 10/09/19.  I dont see anything documented on who called. She would like for whoever called to please call back.

## 2019-10-09 NOTE — Telephone Encounter (Signed)
I did not call patient  Caroline More, DO, PGY-3 [facility] 10/09/2019 9:48 AM

## 2019-10-16 ENCOUNTER — Encounter: Payer: Self-pay | Admitting: Neurology

## 2019-12-01 ENCOUNTER — Other Ambulatory Visit: Payer: Self-pay | Admitting: *Deleted

## 2019-12-01 DIAGNOSIS — I10 Essential (primary) hypertension: Secondary | ICD-10-CM

## 2019-12-01 MED ORDER — LISINOPRIL 20 MG PO TABS: 20.0000 mg | ORAL_TABLET | Freq: Every day | ORAL | 3 refills | Status: DC

## 2020-01-02 NOTE — Progress Notes (Signed)
NEUROLOGY CONSULTATION NOTE  [name] MRN: [id number] DOB: 02-15-1961  Referring provider: Talbert Cage, MD Primary care provider: Darrelyn Hillock, DO  Reason for consult:  polyneuropathy  HISTORY OF PRESENT ILLNESS: [name] is a 59 year old right-handed female with type 2 diabetes mellitus, HTN, HLD, Sickle cell trait, arthritis and history of cervical cancer s/p partial hysterectomy and radiation who presents for polyneuropathy.  History supplemented by Family Medicine note.  For 2 months, she reports pain in the hands.  She notes numbness, tingling, burning and cramps in both hands.  It involves the entire hand.  No associated neck pain or radicular pain down the arms.  No arm weakness but hands sometimes feel weak.  .  Symptoms are intermittent but often.  Symptoms are worse at night.  She also has similar symptoms in the feet for over a year but not as severe.  Symptoms are overall symmetric but slightly worse in right hand.  She has diabetes, which has been controlled.  Hgb A1c from May was 6.0.  She has been improving her lifestyle.  She limits sweets and other carbohydrates.  She is walking daily.  B12 and folate from last November was 400 and 5.7 respectively.    PAST MEDICAL HISTORY: Past Medical History:  Diagnosis Date  . Arthritis   . Cervical cancer ([place]) 2007   s/p partial hysterectomy and radiation  . Diabetes mellitus without complication ([place])   . Hyperlipidemia   . Hypertension   . Sickle cell trait ([place])     PAST SURGICAL HISTORY: Past Surgical History:  Procedure Laterality Date  . ABDOMINAL HYSTERECTOMY  2007   for cervical cancer; radical  . BACK SURGERY    . JOINT REPLACEMENT     left hip replacement 3-5 years ago    MEDICATIONS: Current Outpatient Medications on File Prior to Visit  Medication Sig Dispense Refill  . amLODipine (NORVASC) 10 MG tablet Take 1 tablet (10 mg total) by mouth daily. 90 tablet 3  . diclofenac  Sodium (VOLTAREN) 1 % GEL Apply 2 g topically 4 (four) times daily. 150 g 0  . IBUPROFEN PO Take by mouth.    Marland Kitchen lisinopril (ZESTRIL) 20 MG tablet Take 1 tablet (20 mg total) by mouth daily. 90 tablet 3  . metFORMIN (GLUCOPHAGE) 1000 MG tablet Take 1 tablet (1,000 mg total) by mouth 2 (two) times daily with a meal. 90 tablet 3  . naproxen (NAPROSYN) 500 MG tablet Take 1 tablet (500 mg total) by mouth 2 (two) times daily with a meal. (Patient not taking: Reported on 08/05/2019) 30 tablet 0  . pravastatin (PRAVACHOL) 40 MG tablet Take 1 tablet (40 mg total) by mouth daily. 90 tablet 3   No current facility-administered medications on file prior to visit.    ALLERGIES: Allergies  Allergen Reactions  . Penicillins     FAMILY HISTORY: Family History  Problem Relation Age of Onset  . Cancer Mother        breast cancer, died at 15yo  . Breast cancer Mother 68  . Seizures Father   . Diabetes Maternal Aunt   . Diabetes Maternal Uncle   . Diabetes Brother   . Colon cancer Neg Hx   . Colon polyps Neg Hx   . Esophageal cancer Neg Hx   . Rectal cancer Neg Hx   . Stomach cancer Neg Hx     SOCIAL HISTORY: Social History   Socioeconomic History  . Marital status: Single  Spouse name: Not on file  . Number of children: Not on file  . Years of education: Not on file  . Highest education level: Not on file  Occupational History  . Not on file  Tobacco Use  . Smoking status: Former Smoker    Types: Cigarettes    Quit date: 06/05/1996    Years since quitting: 23.5  . Smokeless tobacco: Never Used  . Tobacco comment: quit 10-15 years ago  Vaping Use  . Vaping Use: Never used  Substance and Sexual Activity  . Alcohol use: Yes    Alcohol/week: 2.0 - 4.0 standard drinks    Types: 2 - 4 Cans of beer per week    Comment: 3-4 times a week  . Drug use: Never  . Sexual activity: Not Currently  Other Topics Concern  . Not on file  Social History Narrative  . Not on file   Social  Determinants of Health   Financial Resource Strain:   . Difficulty of Paying Living Expenses:   Food Insecurity:   . Worried About Charity fundraiser in the Last Year:   . Arboriculturist in the Last Year:   Transportation Needs:   . Film/video editor (Medical):   Marland Kitchen Lack of Transportation (Non-Medical):   Physical Activity:   . Days of Exercise per Week:   . Minutes of Exercise per Session:   Stress:   . Feeling of Stress :   Social Connections:   . Frequency of Communication with Friends and Family:   . Frequency of Social Gatherings with Friends and Family:   . Attends Religious Services:   . Active Member of Clubs or Organizations:   . Attends Archivist Meetings:   Marland Kitchen Marital Status:   Intimate Partner Violence:   . Fear of Current or Ex-Partner:   . Emotionally Abused:   Marland Kitchen Physically Abused:   . Sexually Abused:     PHYSICAL EXAM: Blood pressure (!) 131/85, pulse 86, weight (!) 263 lb 6.4 oz (119.5 kg), SpO2 100 %. General: No acute distress.  Patient appears well-groomed.  Head:  Normocephalic/atraumatic Eyes:  fundi examined but not visualized Neck: supple, no paraspinal tenderness, full range of motion Back: No paraspinal tenderness Heart: regular rate and rhythm Lungs: Clear to auscultation bilaterally. Vascular: No carotid bruits. Neurological Exam: Mental status: alert and oriented to person, place, and time, recent and remote memory intact, fund of knowledge intact, attention and concentration intact, speech fluent and not dysarthric, language intact. Cranial nerves: CN I: not tested CN II: pupils equal, round and reactive to light, visual fields intact CN III, IV, VI:  full range of motion, no nystagmus, no ptosis CN V: facial sensation intact CN VII: upper and lower face symmetric CN VIII: hearing intact CN IX, X: gag intact, uvula midline CN XI: sternocleidomastoid and trapezius muscles intact CN XII: tongue midline Bulk & Tone: normal,  no fasciculations. Motor:  5/5 throughout Sensation:  Pinprick sensation intact; vibratory sensation slightly reduced in feet. Deep Tendon Reflexes:  1+ throughout, toes downgoing.  Finger to nose testing:  Without dysmetria.  Heel to shin:  Without dysmetria.  Gait:  Mildly wide-based.  Able to turn, difficulty tandem walking. Romberg negative.  IMPRESSION: Polyneuropathy.  As diabetes has been controlled for some time, unlikely etiology.  PLAN: 1.  Start gabapentin 100mg  three times daily, titrating to 300mg  three times daily. 2.  Check ANA, sed rate, SPEP/IFE, TSH, ACE, B6 3.  NCV-EMG  right upper and lower extremities 4.  Follow up in 4 to 6 months  Thank you for allowing me to take part in the care of this patient.  Metta Clines, DO

## 2020-01-05 ENCOUNTER — Ambulatory Visit (INDEPENDENT_AMBULATORY_CARE_PROVIDER_SITE_OTHER): Payer: Self-pay | Admitting: Neurology

## 2020-01-05 ENCOUNTER — Encounter: Payer: Self-pay | Admitting: Neurology

## 2020-01-05 ENCOUNTER — Other Ambulatory Visit: Payer: Self-pay

## 2020-01-05 ENCOUNTER — Other Ambulatory Visit (INDEPENDENT_AMBULATORY_CARE_PROVIDER_SITE_OTHER): Payer: No Typology Code available for payment source

## 2020-01-05 VITALS — BP 131/85 | HR 86 | Wt 263.4 lb

## 2020-01-05 DIAGNOSIS — G6289 Other specified polyneuropathies: Secondary | ICD-10-CM

## 2020-01-05 LAB — SEDIMENTATION RATE: Sed Rate: 27 mm/hr (ref 0–30)

## 2020-01-05 LAB — TSH: TSH: 1.94 u[IU]/mL (ref 0.35–4.50)

## 2020-01-05 MED ORDER — GABAPENTIN 100 MG PO CAPS: ORAL_CAPSULE | ORAL | 0 refills | Status: DC

## 2020-01-05 NOTE — Patient Instructions (Addendum)
1.  Start gabapentin.  Take as directed.  If you experience any side effects (dizziness) going up on the dose, then go back and remain on previous dose and let me know.  Contact me for refill. 2.  Check labs:  ANA with reflex, sed rate, SPEP/IFE, ACE, B6, TSH. Your provider has requested that you have labwork completed today. Please go to [facility] ([address]) on the second floor of this building before leaving the office today. You do not need to check in. If you are not called within 15 minutes please check with the front desk.  3.  Nerve conduction study right arm and leg 4.  Follow up in 4 to 6 months.

## 2020-01-06 LAB — ANA W/REFLEX: Anti Nuclear Antibody (ANA): NEGATIVE

## 2020-01-08 ENCOUNTER — Telehealth: Payer: Self-pay

## 2020-01-08 ENCOUNTER — Ambulatory Visit: Payer: No Typology Code available for payment source | Admitting: Family Medicine

## 2020-01-08 LAB — ANGIOTENSIN CONVERTING ENZYME: Angiotensin-Converting Enzyme: <5 U/L — ABNORMAL LOW (ref 9–67)

## 2020-01-08 LAB — PROTEIN ELECTROPHORESIS, SERUM
Albumin ELP: 3.7 g/dL — ABNORMAL LOW (ref 3.8–4.8)
Alpha 1: 0.3 g/dL (ref 0.2–0.3)
Alpha 2: 0.6 g/dL (ref 0.5–0.9)
Beta 2: 0.4 g/dL (ref 0.2–0.5)
Beta Globulin: 0.4 g/dL (ref 0.4–0.6)
Gamma Globulin: 1.2 g/dL (ref 0.8–1.7)
Total Protein: 6.7 g/dL (ref 6.1–8.1)

## 2020-01-08 LAB — VITAMIN B6: Vitamin B6: 9.7 ng/mL (ref 2.1–21.7)

## 2020-01-08 LAB — IMMUNOFIXATION ELECTROPHORESIS
IgG (Immunoglobin G), Serum: 1290 mg/dL (ref 600–1640)
IgM, Serum: 95 mg/dL (ref 50–300)
Immunofix Electr Int: NOT DETECTED
Immunoglobulin A: 267 mg/dL (ref 47–310)

## 2020-01-08 NOTE — Telephone Encounter (Signed)
-----   Message from Cameron Sprang, MD sent at 01/08/2020  9:35 AM EDT ----- Pls let her know bloodwork back is unremarkable, there are only 2 more pending results, will update once back. Thanks

## 2020-01-08 NOTE — Telephone Encounter (Signed)
LMOVM, to call back

## 2020-01-08 NOTE — Telephone Encounter (Signed)
Pt advised of note below

## 2020-02-12 ENCOUNTER — Encounter: Payer: No Typology Code available for payment source | Admitting: Neurology

## 2020-04-05 ENCOUNTER — Other Ambulatory Visit: Payer: Self-pay | Admitting: Family Medicine

## 2020-04-05 DIAGNOSIS — Z1231 Encounter for screening mammogram for malignant neoplasm of breast: Secondary | ICD-10-CM

## 2020-04-08 ENCOUNTER — Other Ambulatory Visit: Payer: Self-pay

## 2020-04-08 ENCOUNTER — Ambulatory Visit
Admission: RE | Admit: 2020-04-08 | Discharge: 2020-04-08 | Disposition: A | Discharge status: Home / Self Care | Payer: No Typology Code available for payment source | Source: Ambulatory Visit | Attending: Family Medicine | Admitting: Family Medicine

## 2020-04-08 DIAGNOSIS — Z1231 Encounter for screening mammogram for malignant neoplasm of breast: Secondary | ICD-10-CM

## 2020-07-07 ENCOUNTER — Ambulatory Visit: Payer: No Typology Code available for payment source | Admitting: Neurology

## 2020-08-11 ENCOUNTER — Ambulatory Visit (INDEPENDENT_AMBULATORY_CARE_PROVIDER_SITE_OTHER): Payer: No Typology Code available for payment source | Admitting: Podiatry

## 2020-08-11 ENCOUNTER — Ambulatory Visit (INDEPENDENT_AMBULATORY_CARE_PROVIDER_SITE_OTHER): Payer: No Typology Code available for payment source

## 2020-08-11 ENCOUNTER — Other Ambulatory Visit: Payer: Self-pay

## 2020-08-11 DIAGNOSIS — M7751 Other enthesopathy of right foot: Secondary | ICD-10-CM | POA: Diagnosis not present

## 2020-08-11 DIAGNOSIS — M7752 Other enthesopathy of left foot: Secondary | ICD-10-CM | POA: Diagnosis not present

## 2020-08-11 DIAGNOSIS — M722 Plantar fascial fibromatosis: Secondary | ICD-10-CM

## 2020-08-12 ENCOUNTER — Telehealth: Payer: Self-pay | Admitting: Podiatry

## 2020-08-12 NOTE — Telephone Encounter (Signed)
[name] calling to request a prescription that was discussed at her appointment. [name] stated Dr. Amalia Hailey mentioned an antiinflammatory medication that the [name] should take. [name] assumed prescription would be ready for pick up after her visit; it was not. Please advise if antiinflammatory is needed.

## 2020-08-13 MED ORDER — MELOXICAM 15 MG PO TABS: 15.0000 mg | ORAL_TABLET | Freq: Every day | ORAL | 1 refills | Status: DC

## 2020-08-24 DIAGNOSIS — M7752 Other enthesopathy of left foot: Secondary | ICD-10-CM | POA: Diagnosis not present

## 2020-08-24 DIAGNOSIS — M722 Plantar fascial fibromatosis: Secondary | ICD-10-CM | POA: Diagnosis not present

## 2020-08-24 DIAGNOSIS — M7751 Other enthesopathy of right foot: Secondary | ICD-10-CM | POA: Diagnosis not present

## 2020-08-24 MED ORDER — BETAMETHASONE SOD PHOS & ACET 6 (3-3) MG/ML IJ SUSP
3.0000 mg | Freq: Once | INTRAMUSCULAR | Status: AC
  Administered 2020-08-24: 3 mg via INTRA_ARTICULAR

## 2020-08-24 NOTE — Progress Notes (Signed)
   Subjective: 60 y.o. female presenting as a new patient for evaluation of bilateral foot pain that has been present for several weeks. She denies a history of injury. She has not done anything for treatment. She presents for further treatment and evaluation.   Past Medical History:  Diagnosis Date  . Arthritis   . Cervical cancer ([place]) 2007   s/p partial hysterectomy and radiation  . Diabetes mellitus without complication ([place])   . Hyperlipidemia   . Hypertension   . Sickle cell trait ([place])      Objective: Physical Exam General: The patient is alert and oriented x3 in no acute distress.  Dermatology: Skin is warm, dry and supple bilateral lower extremities. Negative for open lesions or macerations bilateral.   Vascular: Dorsalis Pedis and Posterior Tibial pulses palpable bilateral.  Capillary fill time is immediate to all digits.  Neurological: Epicritic and protective threshold intact bilateral.   Musculoskeletal: Tenderness to palpation to the plantar aspect of the bilateral heels along the plantar fascia. All other joints range of motion within normal limits bilateral. Strength 5/5 in all groups bilateral.   Radiographic exam: Normal osseous mineralization. Joint spaces preserved. No fracture/dislocation/boney destruction. No other soft tissue abnormalities or radiopaque foreign bodies.   Assessment: 1. plantar fasciitis bilateral feet 2. 1st MTPJ capsulitis bilateral  Plan of Care:  1. Patient evaluated. Xrays reviewed.   2. Injection of 0.5cc Celestone soluspan injected into the bilateral heels.  3. OTC  powerstep insoles provided. Wear daily 4. Rx for Meloxicam ordered for patient. 5. Instructed patient regarding therapies and modalities at home to alleviate symptoms.  6. Return to clinic in 4 weeks.    Edrick Kins, DPM [facility]  Dr. Edrick Kins, DPM    [address], [place] [address]                 Office [phone]  Fax [phone]

## 2020-09-15 ENCOUNTER — Ambulatory Visit: Payer: No Typology Code available for payment source | Admitting: Podiatry

## 2020-09-20 ENCOUNTER — Other Ambulatory Visit: Payer: Self-pay | Admitting: *Deleted

## 2020-09-20 DIAGNOSIS — I10 Essential (primary) hypertension: Secondary | ICD-10-CM

## 2020-09-21 MED ORDER — PRAVASTATIN SODIUM 40 MG PO TABS: 40.0000 mg | ORAL_TABLET | Freq: Every day | ORAL | 3 refills | Status: DC

## 2020-09-23 ENCOUNTER — Ambulatory Visit: Payer: No Typology Code available for payment source

## 2020-09-23 NOTE — Progress Notes (Deleted)
    SUBJECTIVE:   CHIEF COMPLAINT / HPI:   Seen by neuro--   PERTINENT  PMH / PSH: ***  OBJECTIVE:   There were no vitals taken for this visit.  ***  ASSESSMENT/PLAN:   No problem-specific Assessment & Plan notes found for this encounter.     [name], [place]   {    This will disappear when note is signed, click to select method of visit    :1}

## 2020-10-06 ENCOUNTER — Ambulatory Visit: Payer: No Typology Code available for payment source | Admitting: Family Medicine

## 2020-10-18 ENCOUNTER — Other Ambulatory Visit: Payer: Self-pay | Admitting: *Deleted

## 2020-10-18 DIAGNOSIS — I10 Essential (primary) hypertension: Secondary | ICD-10-CM

## 2020-10-18 DIAGNOSIS — E119 Type 2 diabetes mellitus without complications: Secondary | ICD-10-CM

## 2020-10-18 MED ORDER — PRAVASTATIN SODIUM 40 MG PO TABS: 40.0000 mg | ORAL_TABLET | Freq: Every day | ORAL | 1 refills | Status: DC

## 2020-10-18 MED ORDER — METFORMIN HCL 1000 MG PO TABS: 1000.0000 mg | ORAL_TABLET | Freq: Two times a day (BID) | ORAL | 1 refills | Status: DC

## 2020-11-24 ENCOUNTER — Ambulatory Visit: Payer: No Typology Code available for payment source | Admitting: Family Medicine
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
## 2020-12-01 ENCOUNTER — Ambulatory Visit
Admission: RE | Admit: 2020-12-01 | Discharge: 2020-12-01 | Disposition: A | Discharge status: Home / Self Care | Payer: No Typology Code available for payment source | Source: Ambulatory Visit | Attending: Family Medicine | Admitting: Family Medicine

## 2020-12-01 ENCOUNTER — Encounter: Payer: Self-pay | Admitting: Family Medicine

## 2020-12-01 ENCOUNTER — Ambulatory Visit (INDEPENDENT_AMBULATORY_CARE_PROVIDER_SITE_OTHER): Payer: No Typology Code available for payment source | Admitting: Family Medicine

## 2020-12-01 ENCOUNTER — Other Ambulatory Visit: Payer: Self-pay

## 2020-12-01 VITALS — BP 129/101 | HR 91 | Wt 276.0 lb

## 2020-12-01 DIAGNOSIS — I10 Essential (primary) hypertension: Secondary | ICD-10-CM | POA: Diagnosis not present

## 2020-12-01 DIAGNOSIS — C539 Malignant neoplasm of cervix uteri, unspecified: Secondary | ICD-10-CM

## 2020-12-01 DIAGNOSIS — M25551 Pain in right hip: Secondary | ICD-10-CM

## 2020-12-01 DIAGNOSIS — E8881 Metabolic syndrome: Secondary | ICD-10-CM | POA: Diagnosis not present

## 2020-12-01 DIAGNOSIS — E119 Type 2 diabetes mellitus without complications: Secondary | ICD-10-CM | POA: Diagnosis not present

## 2020-12-01 DIAGNOSIS — M79672 Pain in left foot: Secondary | ICD-10-CM

## 2020-12-01 DIAGNOSIS — M79671 Pain in right foot: Secondary | ICD-10-CM

## 2020-12-01 LAB — POCT GLYCOSYLATED HEMOGLOBIN (HGB A1C): HbA1c, POC (controlled diabetic range): 6.2 % (ref 0.0–7.0)

## 2020-12-01 MED ORDER — PRAVASTATIN SODIUM 40 MG PO TABS: 40.0000 mg | ORAL_TABLET | Freq: Every day | ORAL | 1 refills | Status: DC

## 2020-12-01 MED ORDER — AMLODIPINE BESYLATE 10 MG PO TABS: 10.0000 mg | ORAL_TABLET | Freq: Every day | ORAL | 3 refills | Status: DC

## 2020-12-01 NOTE — Patient Instructions (Addendum)
Lets get some Xrays of your hip to assess the severity of arthritis. I will send in meloxicam after your kidney function returns.   I will get your kidney function and then we will send in your bp and diabetes medication.   Keep using heat 20 min at a time several times daily.   If you dont hear from anyone with OB/GYN in the next several weeks--please call our office.

## 2020-12-01 NOTE — Assessment & Plan Note (Signed)
Refilled pravastatin.

## 2020-12-01 NOTE — Assessment & Plan Note (Signed)
Diagnosed with bilateral plantar fasciitis and capsulitis via podiatry in 08/2020.  Will follow-up with podiatry.

## 2020-12-01 NOTE — Assessment & Plan Note (Signed)
Chronic, worsening.  Overall presentation sounds most consistent with worsening osteoarthritis given her history, will initially assess with x-rays.  She has no previous imaging to review.  Could also consider muscular imbalances around her hip with abnormal gait in relation to her foot pain.  Continue heat/Tylenol/NSAIDs as needed.  Encouraged gentle ROM exercises.  Pending on results of x-rays, may refer to orthopedics vs trial of PT.

## 2020-12-01 NOTE — Addendum Note (Signed)
Addended by: Patriciaann Clan on: 12/01/2020 11:00 AM   Modules accepted: Orders

## 2020-12-01 NOTE — Assessment & Plan Note (Signed)
S/p radical hysterectomy.  Previously followed with Dr. Denman George, gyn/onc, last seen in 2016 with a normal vaginal Pap smear and recommended yearly follow-up.  She was referred back to them in 2020 however denied, will replace this referral for follow-up.

## 2020-12-01 NOTE — Assessment & Plan Note (Signed)
A1c 6.2% today, well controlled on metformin only.  Update BMP today and then will send in refill.  Will discuss diabetic eye exam on follow-up visit.

## 2020-12-01 NOTE — Assessment & Plan Note (Signed)
Uncontrolled today, however is not taking antihypertensives.  We will refill Norvasc today and obtain updated BMP prior to refilling lisinopril.

## 2020-12-02 ENCOUNTER — Other Ambulatory Visit: Payer: Self-pay | Admitting: Family Medicine

## 2020-12-02 DIAGNOSIS — M25551 Pain in right hip: Secondary | ICD-10-CM

## 2020-12-02 DIAGNOSIS — I10 Essential (primary) hypertension: Secondary | ICD-10-CM

## 2020-12-02 DIAGNOSIS — E119 Type 2 diabetes mellitus without complications: Secondary | ICD-10-CM

## 2020-12-02 LAB — BASIC METABOLIC PANEL
BUN/Creatinine Ratio: 14 (ref 12–28)
BUN: 11 mg/dL (ref 8–27)
CO2: 26 mmol/L (ref 20–29)
Calcium: 9.6 mg/dL (ref 8.7–10.3)
Chloride: 106 mmol/L (ref 96–106)
Creatinine, Ser: 0.76 mg/dL (ref 0.57–1.00)
Glucose: 135 mg/dL — ABNORMAL HIGH (ref 65–99)
Potassium: 4.4 mmol/L (ref 3.5–5.2)
Sodium: 146 mmol/L — ABNORMAL HIGH (ref 134–144)
eGFR: 89 mL/min/{1.73_m2} (ref >59)

## 2020-12-02 MED ORDER — MELOXICAM 15 MG PO TABS: 15.0000 mg | ORAL_TABLET | Freq: Every day | ORAL | 0 refills | Status: DC | PRN

## 2020-12-02 MED ORDER — LISINOPRIL 20 MG PO TABS: 20.0000 mg | ORAL_TABLET | Freq: Every day | ORAL | 1 refills | Status: DC

## 2020-12-02 MED ORDER — METFORMIN HCL 1000 MG PO TABS: 1000.0000 mg | ORAL_TABLET | Freq: Two times a day (BID) | ORAL | 1 refills | Status: DC

## 2020-12-15 ENCOUNTER — Ambulatory Visit (INDEPENDENT_AMBULATORY_CARE_PROVIDER_SITE_OTHER): Payer: No Typology Code available for payment source | Admitting: Orthopaedic Surgery

## 2020-12-15 ENCOUNTER — Ambulatory Visit (INDEPENDENT_AMBULATORY_CARE_PROVIDER_SITE_OTHER): Payer: No Typology Code available for payment source

## 2020-12-15 ENCOUNTER — Encounter: Payer: Self-pay | Admitting: Orthopaedic Surgery

## 2020-12-15 ENCOUNTER — Other Ambulatory Visit: Payer: Self-pay

## 2020-12-15 DIAGNOSIS — M5441 Lumbago with sciatica, right side: Secondary | ICD-10-CM | POA: Diagnosis not present

## 2020-12-15 DIAGNOSIS — G8929 Other chronic pain: Secondary | ICD-10-CM

## 2020-12-15 DIAGNOSIS — M1711 Unilateral primary osteoarthritis, right knee: Secondary | ICD-10-CM | POA: Diagnosis not present

## 2020-12-15 MED ORDER — PREDNISONE 5 MG (21) PO TBPK: ORAL_TABLET | ORAL | 0 refills | Status: DC

## 2020-12-15 MED ORDER — METHOCARBAMOL 500 MG PO TABS: 500.0000 mg | ORAL_TABLET | Freq: Two times a day (BID) | ORAL | 0 refills | Status: DC | PRN

## 2020-12-15 NOTE — Progress Notes (Signed)
Office Visit Note   Patient: [name]           Date of Birth: 12-01-1960           MRN: [id number] Visit Date: 12/15/2020              Requested by: Martyn Malay, MD [address] [place],  [place] [address] PCP: Eulis Foster, MD   Assessment & Plan: Visit Diagnoses:  1. Unilateral primary osteoarthritis, right knee   2. Chronic bilateral low back pain with right-sided sciatica     Plan: Impression is right knee osteoarthritis and right sided lumbar radiculopathy.  In regards to the right knee, we have discussed cortisone injection and weight loss which I think will also help with her back.  In regards to her back and right lower extremity radiculopathy, I recommended a short course of steroids in addition to physical therapy.  Internal referral has been made.  Follow-up with [place] as needed.  Follow-Up Instructions: Return if symptoms worsen or fail to improve.   Orders:  Orders Placed This Encounter  Procedures   XR Lumbar Spine 2-3 Views   XR Knee Complete 4 Views Right   No orders of the defined types were placed in this encounter.     Procedures: Large Joint Inj: R knee on 12/15/2020 10:49 AM Indications: pain Details: 22 G needle, anterolateral approach Medications: 2 mL lidocaine 1 %; 2 mL bupivacaine 0.25 %; 40 mg methylPREDNISolone acetate 40 MG/ML     Clinical Data: No additional findings.   Subjective: Chief Complaint  Patient presents with   Right Hip - Pain    HPI patient is a pleasant 60 year old female who comes in today with bilateral lower back pain, right lower extremity radiculopathy in addition to right knee pain.  She denies any pain to the groin or anterior thigh.  The symptoms have been ongoing for the past year but have progressively worsened.  The back and knee pain both are worse with standing, lying or sitting to standing position.  She has been using topical NSAIDs without significant relief.  She does note  numbness and to all of her toes.  She denies any previous cortisone injection or course of physical therapy.  Of note, she is status post left total hip replacement doing well there.  Review of Systems   Objective: Vital Signs: There were no vitals taken for this visit.  Physical Exam well-developed well-nourished female no acute distress.  Alert and oriented x3.  Ortho Exam right hip exam shows negative logroll negative FADIR.  Positive straight leg raise.  No lumbar spine pain but she does have mild paraspinous tenderness both sides.  Increased pain with lumbar flexion and extension.  No focal weakness.  She is neurovascular intact distally.  Right knee exam shows no effusion.  Range of motion 0 to 100 degrees.  Lateral joint line tenderness.  Marked patellofemoral crepitus.  Ligaments are stable.  Specialty Comments:  No specialty comments available.  Imaging: XR Knee Complete 4 Views Right  Result Date: 12/15/2020 Moderate medial compartment degenerative changes  XR Lumbar Spine 2-3 Views  Result Date: 12/15/2020 Moderate degenerative changes L5-S1    PMFS History: Patient Active Problem List   Diagnosis Date Noted   Right hip pain 12/01/2020   Foot pain, bilateral 12/01/2020   Diabetic neuropathy ([place]) 04/17/2019   Low back pain 03/21/2019   Controlled type 2 diabetes mellitus without complication, without long-term current use of insulin ([place])  37/79/3968   Metabolic syndrome 86/48/4720   Primary hypertension 03/31/2012   Malignant neoplasm of cervix uteri ([place]) 08/02/2006   Past Medical History:  Diagnosis Date   Arthritis    Cervical cancer ([place]) 2007   s/p partial hysterectomy and radiation   Diabetes mellitus without complication (HCC)    Hyperlipidemia    Hypertension    Sickle cell trait ([place])     Family History  Problem Relation Age of Onset   Cancer Mother        breast cancer, died at 88yo   Breast cancer Mother 39   Seizures Father    Diabetes  Maternal Aunt    Diabetes Maternal Uncle    Diabetes Brother    Colon cancer Neg Hx    Colon polyps Neg Hx    Esophageal cancer Neg Hx    Rectal cancer Neg Hx    Stomach cancer Neg Hx     Past Surgical History:  Procedure Laterality Date   ABDOMINAL HYSTERECTOMY  2007   for cervical cancer; radical   BACK SURGERY     JOINT REPLACEMENT     left hip replacement 3-5 years ago   Social History   Occupational History   Not on file  Tobacco Use   Smoking status: Former    Pack years: 0.00    Types: Cigarettes    Quit date: 06/05/1996    Years since quitting: 24.5   Smokeless tobacco: Never   Tobacco comments:    quit 10-15 years ago  Vaping Use   Vaping Use: Never used  Substance and Sexual Activity   Alcohol use: Yes    Alcohol/week: 2.0 - 4.0 standard drinks    Types: 2 - 4 Cans of beer per week    Comment: 3-4 times a week   Drug use: Never   Sexual activity: Not Currently

## 2020-12-16 MED ORDER — LIDOCAINE HCL 1 % IJ SOLN
2.0000 mL | INTRAMUSCULAR | Status: AC | PRN
  Administered 2020-12-15: 2 mL

## 2020-12-16 MED ORDER — BUPIVACAINE HCL 0.25 % IJ SOLN
2.0000 mL | INTRAMUSCULAR | Status: AC | PRN
  Administered 2020-12-15: 2 mL via INTRA_ARTICULAR

## 2020-12-16 MED ORDER — METHYLPREDNISOLONE ACETATE 40 MG/ML IJ SUSP
40.0000 mg | INTRAMUSCULAR | Status: AC | PRN
  Administered 2020-12-15: 40 mg via INTRA_ARTICULAR

## 2021-02-16 ENCOUNTER — Other Ambulatory Visit: Payer: Self-pay | Admitting: Family Medicine

## 2021-02-16 DIAGNOSIS — M25551 Pain in right hip: Secondary | ICD-10-CM

## 2021-03-25 ENCOUNTER — Other Ambulatory Visit: Payer: Self-pay | Admitting: Family Medicine

## 2021-03-25 DIAGNOSIS — Z1231 Encounter for screening mammogram for malignant neoplasm of breast: Secondary | ICD-10-CM

## 2021-04-15 ENCOUNTER — Other Ambulatory Visit: Payer: Self-pay

## 2021-04-15 ENCOUNTER — Ambulatory Visit (INDEPENDENT_AMBULATORY_CARE_PROVIDER_SITE_OTHER): Payer: No Typology Code available for payment source | Admitting: Orthopaedic Surgery

## 2021-04-15 ENCOUNTER — Telehealth: Payer: Self-pay

## 2021-04-15 DIAGNOSIS — M1711 Unilateral primary osteoarthritis, right knee: Secondary | ICD-10-CM | POA: Diagnosis not present

## 2021-04-15 DIAGNOSIS — M545 Low back pain, unspecified: Secondary | ICD-10-CM | POA: Diagnosis not present

## 2021-04-15 MED ORDER — BUPIVACAINE HCL 0.25 % IJ SOLN
2.0000 mL | INTRAMUSCULAR | Status: AC | PRN
  Administered 2021-04-15: 2 mL via INTRA_ARTICULAR

## 2021-04-15 MED ORDER — LIDOCAINE HCL 1 % IJ SOLN
2.0000 mL | INTRAMUSCULAR | Status: AC | PRN
  Administered 2021-04-15: 2 mL

## 2021-04-15 MED ORDER — METHYLPREDNISOLONE ACETATE 40 MG/ML IJ SUSP
40.0000 mg | INTRAMUSCULAR | Status: AC | PRN
  Administered 2021-04-15: 40 mg via INTRA_ARTICULAR

## 2021-04-15 MED ORDER — TRAMADOL HCL 50 MG PO TABS
50.0000 mg | ORAL_TABLET | Freq: Two times a day (BID) | ORAL | 2 refills | Status: DC | PRN
Start: 2021-04-15 — End: 2022-01-30

## 2021-04-15 NOTE — Progress Notes (Signed)
Office Visit Note   Patient: [name]           Date of Birth: 05/20/1961           MRN: [id number] Visit Date: 04/15/2021              Requested by: Eulis Foster, MD [address],  [place] [address] PCP: Eulis Foster, MD   Assessment & Plan: Visit Diagnoses:  1. Unilateral primary osteoarthritis, right knee   2. Low back pain, unspecified back pain laterality, unspecified chronicity, unspecified whether sciatica present     Plan: Impression is chronic right sided lower back pain with right lower extremity radiculopathy and right knee osteoarthritis.  In regards to the lumbar spine, she has tried medications as well as several months of a home exercise program without relief.  I would like to go ahead and order an MRI to assess for structural abnormalities.  Follow-up with [place] after the MRI.  Regards to the right knee, we have discussed repeat cortisone injection in addition to obtaining approval for viscosupplementation injection.  Follow-up with [place] once approved.  Call with concerns or questions in meantime.  Follow-Up Instructions: Return for f/u after MRI lumbar spine.   Orders:  Orders Placed This Encounter  Procedures   Large Joint Inj: R knee   MR Lumbar Spine w/o contrast   No orders of the defined types were placed in this encounter.     Procedures: Large Joint Inj: R knee on 04/15/2021 9:44 AM Indications: pain Details: 22 G needle, anterolateral approach Medications: 2 mL lidocaine 1 %; 2 mL bupivacaine 0.25 %; 40 mg methylPREDNISolone acetate 40 MG/ML     Clinical Data: No additional findings.   Subjective: Chief Complaint  Patient presents with   Right Knee - Pain   Lower Back - Pain    HPI patient is a pleasant 60 year old female who comes in today with recurrent right knee pain and right back pain and right lower extremity radiculopathy.  She was seen in our office in July for this.  In regards to the  right knee, she had this injected with cortisone which significantly helped for a few months.  Her pain is returned and is gradually worsened.  She has increased pain with activity.  In regards to the back, she continues to endorse pain in the right lower lumbar region which radiates down the back of the leg and into the foot.  She has been working on home exercise program for over 3 months without relief.  Steroids and NSAIDs did not provide much relief.  She denies any bowel or bladder dysfunction.  Review of Systems as detailed in HPI.  All others June are negative.   Objective: Vital Signs: There were no vitals taken for this visit.  Physical Exam well-developed well-nourished female no acute distress.  Alert and oriented x3.  Ortho Exam lumbar spine exam shows no spinous tenderness she has mild right-sided paraspinous tenderness with increased pain with lumbar flexion, extension and rotation.  Positive straight leg raise.  Right knee exam shows a small effusion.  Range of motion 0 to 100 degrees.  Medial joint tenderness.  She is neurovascular intact distally.  Specialty Comments:  No specialty comments available.  Imaging: No new imaging   PMFS History: Patient Active Problem List   Diagnosis Date Noted   Right hip pain 12/01/2020   Foot pain, bilateral 12/01/2020   Diabetic neuropathy ([place]) 04/17/2019   Low back pain  03/21/2019   Controlled type 2 diabetes mellitus without complication, without long-term current use of insulin ([place]) 25/36/6440   Metabolic syndrome 34/74/2595   Primary hypertension 03/31/2012   Malignant neoplasm of cervix uteri ([place]) 08/02/2006   Past Medical History:  Diagnosis Date   Arthritis    Cervical cancer ([place]) 2007   s/p partial hysterectomy and radiation   Diabetes mellitus without complication (HCC)    Hyperlipidemia    Hypertension    Sickle cell trait ([place])     Family History  Problem Relation Age of Onset   Cancer Mother        breast  cancer, died at 51yo   Breast cancer Mother 53   Seizures Father    Diabetes Maternal Aunt    Diabetes Maternal Uncle    Diabetes Brother    Colon cancer Neg Hx    Colon polyps Neg Hx    Esophageal cancer Neg Hx    Rectal cancer Neg Hx    Stomach cancer Neg Hx     Past Surgical History:  Procedure Laterality Date   ABDOMINAL HYSTERECTOMY  2007   for cervical cancer; radical   BACK SURGERY     JOINT REPLACEMENT     left hip replacement 3-5 years ago   Social History   Occupational History   Not on file  Tobacco Use   Smoking status: Former    Types: Cigarettes    Quit date: 06/05/1996    Years since quitting: 24.8   Smokeless tobacco: Never   Tobacco comments:    quit 10-15 years ago  Vaping Use   Vaping Use: Never used  Substance and Sexual Activity   Alcohol use: Yes    Alcohol/week: 2.0 - 4.0 standard drinks    Types: 2 - 4 Cans of beer per week    Comment: 3-4 times a week   Drug use: Never   Sexual activity: Not Currently

## 2021-04-15 NOTE — Telephone Encounter (Signed)
Please submit for right knee gel injection.

## 2021-04-15 NOTE — Telephone Encounter (Signed)
Noted  

## 2021-04-16 ENCOUNTER — Other Ambulatory Visit: Payer: Self-pay | Admitting: Family Medicine

## 2021-04-16 DIAGNOSIS — M25551 Pain in right hip: Secondary | ICD-10-CM

## 2021-04-16 DIAGNOSIS — E119 Type 2 diabetes mellitus without complications: Secondary | ICD-10-CM

## 2021-04-27 ENCOUNTER — Ambulatory Visit: Payer: No Typology Code available for payment source

## 2021-05-02 ENCOUNTER — Ambulatory Visit
Admission: RE | Admit: 2021-05-02 | Discharge: 2021-05-02 | Disposition: A | Discharge status: Home / Self Care | Payer: No Typology Code available for payment source | Source: Ambulatory Visit | Attending: Family Medicine | Admitting: Family Medicine

## 2021-05-02 DIAGNOSIS — Z1231 Encounter for screening mammogram for malignant neoplasm of breast: Secondary | ICD-10-CM

## 2021-05-21 ENCOUNTER — Other Ambulatory Visit: Payer: No Typology Code available for payment source

## 2021-05-24 ENCOUNTER — Ambulatory Visit: Payer: No Typology Code available for payment source | Admitting: Orthopaedic Surgery

## 2021-06-12 ENCOUNTER — Other Ambulatory Visit: Payer: Self-pay | Admitting: Family Medicine

## 2021-06-12 DIAGNOSIS — I10 Essential (primary) hypertension: Secondary | ICD-10-CM

## 2021-06-15 ENCOUNTER — Other Ambulatory Visit: Payer: Self-pay | Admitting: Family Medicine

## 2021-06-15 DIAGNOSIS — I10 Essential (primary) hypertension: Secondary | ICD-10-CM

## 2021-08-11 ENCOUNTER — Other Ambulatory Visit: Payer: Self-pay | Admitting: Family Medicine

## 2021-08-11 DIAGNOSIS — I10 Essential (primary) hypertension: Secondary | ICD-10-CM

## 2021-09-24 ENCOUNTER — Other Ambulatory Visit: Payer: Self-pay | Admitting: Family Medicine

## 2021-09-24 DIAGNOSIS — I10 Essential (primary) hypertension: Secondary | ICD-10-CM

## 2021-11-12 ENCOUNTER — Other Ambulatory Visit: Payer: Self-pay | Admitting: Family Medicine

## 2021-11-12 DIAGNOSIS — I10 Essential (primary) hypertension: Secondary | ICD-10-CM

## 2021-12-05 ENCOUNTER — Other Ambulatory Visit: Payer: Self-pay | Admitting: Family Medicine

## 2021-12-05 DIAGNOSIS — I10 Essential (primary) hypertension: Secondary | ICD-10-CM

## 2022-01-16 NOTE — Progress Notes (Unsigned)
    SUBJECTIVE:   CHIEF COMPLAINT / HPI:   T2DM Hgb A1c *** today. Pt currently takes ***  PERTINENT  PMH / PSH: ***  OBJECTIVE:   There were no vitals taken for this visit. ***  General: NAD, pleasant, able to participate in exam Cardiac: RRR, no murmurs. Respiratory: CTAB, normal effort, No wheezes, rales or rhonchi Abdomen: Bowel sounds present, nontender, nondistended, no hepatosplenomegaly. Extremities: no edema or cyanosis. Skin: warm and dry, no rashes noted Neuro: alert, no obvious focal deficits Psych: Normal affect and mood  ASSESSMENT/PLAN:   No problem-specific Assessment & Plan notes found for this encounter.     Dr. Precious Gilding, [place]    {    This will disappear when note is signed, click to select method of visit    :1}

## 2022-01-17 ENCOUNTER — Encounter: Payer: Self-pay | Admitting: Student

## 2022-01-17 ENCOUNTER — Other Ambulatory Visit: Payer: Self-pay

## 2022-01-17 ENCOUNTER — Ambulatory Visit (INDEPENDENT_AMBULATORY_CARE_PROVIDER_SITE_OTHER): Payer: No Typology Code available for payment source | Admitting: Student

## 2022-01-17 VITALS — BP 138/119 | HR 104 | Wt 291.0 lb

## 2022-01-17 DIAGNOSIS — E785 Hyperlipidemia, unspecified: Secondary | ICD-10-CM

## 2022-01-17 DIAGNOSIS — I10 Essential (primary) hypertension: Secondary | ICD-10-CM

## 2022-01-17 DIAGNOSIS — E119 Type 2 diabetes mellitus without complications: Secondary | ICD-10-CM

## 2022-01-17 DIAGNOSIS — Z8541 Personal history of malignant neoplasm of cervix uteri: Secondary | ICD-10-CM

## 2022-01-17 LAB — POCT GLYCOSYLATED HEMOGLOBIN (HGB A1C): HbA1c, POC (controlled diabetic range): 6.5 % (ref 0.0–7.0)

## 2022-01-17 MED ORDER — LISINOPRIL 20 MG PO TABS: 20.0000 mg | ORAL_TABLET | Freq: Every day | ORAL | 0 refills | Status: DC

## 2022-01-17 MED ORDER — SEMAGLUTIDE(0.25 OR 0.5MG/DOS) 2 MG/3ML ~~LOC~~ SOPN: 0.2500 mg | PEN_INJECTOR | SUBCUTANEOUS | 0 refills | Status: DC

## 2022-01-17 MED ORDER — METFORMIN HCL 1000 MG PO TABS
ORAL_TABLET | ORAL | 1 refills | Status: DC
Start: 2022-01-17 — End: 2023-06-07

## 2022-01-17 MED ORDER — PRAVASTATIN SODIUM 40 MG PO TABS: 40.0000 mg | ORAL_TABLET | Freq: Every day | ORAL | 0 refills | Status: DC

## 2022-01-17 MED ORDER — AMLODIPINE BESYLATE 10 MG PO TABS: 10.0000 mg | ORAL_TABLET | Freq: Every day | ORAL | 0 refills | Status: DC

## 2022-01-17 NOTE — Patient Instructions (Signed)
It was great to see you! Thank you for allowing me to participate in your care!  I recommend that you always bring your medications to each appointment as this makes it easy to ensure you are on the correct medications and helps [place] not miss when refills are needed.  Our plans for today:  -I have referred you to gynecology to follow-up on your history of cervical cancer and need for Pap smear -I sent in prescriptions of your metformin, amlodipine, lisinopril, and pravastatin -I have sent in a prescription for a medication to help with diabetes and weight loss called Ozempic.  Start taking 0.25 mg injected weekly for 4 weeks.  If you tolerate this well, let me know and we can increase the dosage to 0.5 mg weekly.  We are checking some labs today, I will call you if they are abnormal will send you a MyChart message or a letter if they are normal.  If you do not hear about your labs in the next 2 weeks please let us know.  Take care and seek immediate care sooner if you develop any concerns.   Dr. Precious Gilding, DO [facility]

## 2022-01-18 ENCOUNTER — Encounter: Payer: Self-pay | Admitting: Student

## 2022-01-18 DIAGNOSIS — E785 Hyperlipidemia, unspecified: Secondary | ICD-10-CM | POA: Insufficient documentation

## 2022-01-18 DIAGNOSIS — Z8541 Personal history of malignant neoplasm of cervix uteri: Secondary | ICD-10-CM | POA: Insufficient documentation

## 2022-01-18 LAB — BASIC METABOLIC PANEL
BUN/Creatinine Ratio: 19 (ref 12–28)
BUN: 19 mg/dL (ref 8–27)
CO2: 21 mmol/L (ref 20–29)
Calcium: 9.8 mg/dL (ref 8.7–10.3)
Chloride: 100 mmol/L (ref 96–106)
Creatinine, Ser: 1 mg/dL (ref 0.57–1.00)
Glucose: 105 mg/dL — ABNORMAL HIGH (ref 70–99)
Potassium: 3.7 mmol/L (ref 3.5–5.2)
Sodium: 139 mmol/L (ref 134–144)
eGFR: 64 mL/min/{1.73_m2} (ref >59)

## 2022-01-18 LAB — LIPID PANEL
Chol/HDL Ratio: 2.6 ratio (ref 0.0–4.4)
Cholesterol, Total: 219 mg/dL — ABNORMAL HIGH (ref 100–199)
HDL: 83 mg/dL (ref >39)
LDL Chol Calc (NIH): 113 mg/dL — ABNORMAL HIGH (ref 0–99)
Triglycerides: 134 mg/dL (ref 0–149)
VLDL Cholesterol Cal: 23 mg/dL (ref 5–40)

## 2022-01-18 NOTE — Assessment & Plan Note (Signed)
BP likely elevated d/t pt not having medications recently. Lisinopril and amlodipine refilled. Pt to return for follow up visit in 4 months.  -BMP

## 2022-01-18 NOTE — Assessment & Plan Note (Signed)
Although pt has has hysterectomy, because of hx of cervical cancer she is overdue for pap smear.  -referral placed to [facility]

## 2022-01-18 NOTE — Assessment & Plan Note (Addendum)
A1c today . -Continue metformin -Continue pravistatin and will obtain lipid panel today -Will start ozempic as well for obesity and will also help with T2DM

## 2022-01-18 NOTE — Assessment & Plan Note (Signed)
Discussed Ozempic with pt including benefit and possible side effects. She would like to start this today.  -Ozempic  Mg injected weekly, will increase as tolerated

## 2022-01-20 ENCOUNTER — Other Ambulatory Visit (HOSPITAL_COMMUNITY): Payer: Self-pay

## 2022-01-20 ENCOUNTER — Telehealth: Payer: Self-pay

## 2022-01-20 NOTE — Telephone Encounter (Signed)
Prior Auth for patients medication OZEMPIC approved by [facility] from 01/20/22 to 01/20/23.  Key: [id number]

## 2022-01-20 NOTE — Telephone Encounter (Signed)
A Prior Authorization was initiated for this patients OZEMPIC through CoverMyMeds.   Attached chart notes from 01/17/22  Key: [id number]

## 2022-01-26 NOTE — Telephone Encounter (Signed)
Patient calls nurse line in regards to [place].   Patient reports she has still not been able to pick up from the pharmacy.   Per chart we were able to get medication approved.   I called the pharmacy. Pharmacy reported with PA the medication is 900 dollars.   Will forward to [place].

## 2022-01-27 ENCOUNTER — Other Ambulatory Visit (HOSPITAL_COMMUNITY): Payer: Self-pay

## 2022-01-30 ENCOUNTER — Ambulatory Visit (INDEPENDENT_AMBULATORY_CARE_PROVIDER_SITE_OTHER): Payer: No Typology Code available for payment source | Admitting: Obstetrics and Gynecology

## 2022-01-30 ENCOUNTER — Other Ambulatory Visit (HOSPITAL_COMMUNITY)
Admission: RE | Admit: 2022-01-30 | Discharge: 2022-01-30 | Disposition: A | Discharge status: Home / Self Care | Payer: No Typology Code available for payment source | Source: Ambulatory Visit | Attending: Obstetrics and Gynecology | Admitting: Obstetrics and Gynecology

## 2022-01-30 ENCOUNTER — Encounter: Payer: Self-pay | Admitting: Obstetrics and Gynecology

## 2022-01-30 VITALS — BP 122/70 | HR 104 | Ht 68.25 in | Wt 278.0 lb

## 2022-01-30 DIAGNOSIS — Z01419 Encounter for gynecological examination (general) (routine) without abnormal findings: Secondary | ICD-10-CM

## 2022-01-30 DIAGNOSIS — Z1272 Encounter for screening for malignant neoplasm of vagina: Secondary | ICD-10-CM | POA: Insufficient documentation

## 2022-01-30 DIAGNOSIS — Z8541 Personal history of malignant neoplasm of cervix uteri: Secondary | ICD-10-CM | POA: Diagnosis present

## 2022-01-30 DIAGNOSIS — Z1159 Encounter for screening for other viral diseases: Secondary | ICD-10-CM

## 2022-01-30 NOTE — Progress Notes (Signed)
61 y.o. [id number] Single African American female here for annual exam.    History of cervical cancer.  Had a radical hysterectomy.  Ovaries remain.  Did XRT also.   Declines STD testing other than hep C testing.   PCP:  Precious Gilding, DO   No LMP recorded. Patient has had a hysterectomy.           Sexually active: No.  The current method of family planning is status post hysterectomy.    Exercising: No.  The patient does not participate in regular exercise at present. Smoker:  Former  Health Maintenance: Pap:  Years ago History of abnormal Pap:  Unsure MMG: 05-02-21 Neg/BiRads1 Colonoscopy:  08/19/19 - polyps, [place].  Due in 3 years.  BMD:   n/a  Result  n/a TDaP:  PCP 08-14-18 Gardasil:   no HIV: 08-14-18 NR Hep C:unsure Screening Labs:  PCP   reports that she quit smoking about 25 years ago. Her smoking use included cigarettes. She has never used smokeless tobacco. She reports current alcohol use of about 3.0 - 4.0 standard drinks of alcohol per week. She reports that she does not use drugs.  Past Medical History:  Diagnosis Date   Arthritis    Cervical cancer ([place]) 2007   s/p partial hysterectomy and radiation   Diabetes mellitus without complication ([place])    Hyperlipidemia    Hypertension    Sickle cell trait ([place])     Past Surgical History:  Procedure Laterality Date   ABDOMINAL HYSTERECTOMY  2007   for cervical cancer; radical   BACK SURGERY     JOINT REPLACEMENT     left hip replacement 3-5 years ago    Current Outpatient Medications  Medication Sig Dispense Refill   amLODipine (NORVASC) 10 MG tablet Take 1 tablet (10 mg total) by mouth daily. 30 tablet 0   lisinopril (ZESTRIL) 20 MG tablet Take 1 tablet (20 mg total) by mouth daily. 30 tablet 0   metFORMIN (GLUCOPHAGE) 1000 MG tablet TAKE 1 TABLET BY MOUTH TWICE DAILY WITH  A  MEAL. FOLLOW UP WITH PCP. 180 tablet 1   pravastatin (PRAVACHOL) 40 MG tablet Take 1 tablet (40 mg total) by mouth daily. 30 tablet  0   predniSONE (STERAPRED UNI-PAK 21 TAB) 5 MG (21) TBPK tablet Take as directed 21 tablet 0   No current facility-administered medications for this visit.    Family History  Problem Relation Age of Onset   Cancer Mother        breast cancer, died at 34yo   Breast cancer Mother 50   Seizures Father    Diabetes Maternal Aunt    Diabetes Maternal Uncle    Diabetes Brother    Colon cancer Neg Hx    Colon polyps Neg Hx    Esophageal cancer Neg Hx    Rectal cancer Neg Hx    Stomach cancer Neg Hx     Review of Systems  All other systems reviewed and are negative.   Exam:   BP 122/70   Pulse (!) 104   Ht 5' 8.25" (1.734 m)   Wt 278 lb (126.1 kg)   SpO2 99%   BMI 41.96 kg/m     General appearance: alert, cooperative and appears stated age Head: normocephalic, without obvious abnormality, atraumatic Neck: no adenopathy, supple, symmetrical, trachea midline and thyroid normal to inspection and palpation Lungs: clear to auscultation bilaterally Breasts: normal appearance, no masses or tenderness, No nipple retraction or dimpling, No nipple  discharge or bleeding, No axillary adenopathy Heart: regular rate and rhythm Abdomen: soft, non-tender; no masses, no organomegaly Extremities: extremities normal, atraumatic, no cyanosis or edema Skin: skin color, texture, turgor normal. No rashes or lesions Lymph nodes: cervical, supraclavicular, and axillary nodes normal. Neurologic: grossly normal  Pelvic: External genitalia:  no lesions              No abnormal inguinal nodes palpated.              Urethra:  normal appearing urethra with no masses, tenderness or lesions              Bartholins and Skenes: normal                 Vagina: normal appearing vagina with normal color and discharge, no lesions              Cervix: absent              Pap taken: yes Bimanual Exam:  Uterus:  absent              Adnexa: no mass, fullness, tenderness              Rectal exam: yes.  Confirms.               Anus:  normal sphincter tone, no lesions  Chaperone was present for exam:  Estill Bamberg, CMA  Assessment:   Well woman visit with gynecologic exam. Status post radical hysterectomy for cervical cancer.  Status post radiation therapy.  FH breast cancer in mother.    Plan: Mammogram screening discussed.  Locations discussed.  She will scheduled.  Self breast awareness reviewed. Pap and HR HPV collected. Guidelines for Calcium, Vitamin D, regular exercise program including cardiovascular and weight bearing exercise. Check hep C aby.   Follow up annually and prn.   After visit summary provided.

## 2022-01-31 LAB — HEPATITIS C ANTIBODY: Hepatitis C Ab: NONREACTIVE

## 2022-02-13 ENCOUNTER — Other Ambulatory Visit: Payer: Self-pay | Admitting: Student

## 2022-02-13 DIAGNOSIS — Z1231 Encounter for screening mammogram for malignant neoplasm of breast: Secondary | ICD-10-CM

## 2022-02-16 ENCOUNTER — Ambulatory Visit: Payer: No Typology Code available for payment source | Admitting: Orthopaedic Surgery

## 2022-02-20 ENCOUNTER — Other Ambulatory Visit: Payer: Self-pay | Admitting: Student

## 2022-02-20 DIAGNOSIS — I10 Essential (primary) hypertension: Secondary | ICD-10-CM

## 2022-02-22 ENCOUNTER — Telehealth: Payer: Self-pay | Admitting: *Deleted

## 2022-02-22 DIAGNOSIS — E119 Type 2 diabetes mellitus without complications: Secondary | ICD-10-CM

## 2022-02-22 NOTE — Telephone Encounter (Signed)
Patient left message on referral line requesting to see [facility] for ophthalmology.  Will forward to MD to place referral.  [name]

## 2022-02-24 ENCOUNTER — Ambulatory Visit: Payer: No Typology Code available for payment source | Admitting: Orthopaedic Surgery

## 2022-03-02 ENCOUNTER — Telehealth: Payer: Self-pay

## 2022-03-02 ENCOUNTER — Other Ambulatory Visit: Payer: Self-pay

## 2022-03-02 DIAGNOSIS — I10 Essential (primary) hypertension: Secondary | ICD-10-CM

## 2022-03-02 DIAGNOSIS — E785 Hyperlipidemia, unspecified: Secondary | ICD-10-CM

## 2022-03-02 MED ORDER — PRAVASTATIN SODIUM 40 MG PO TABS: 40.0000 mg | ORAL_TABLET | Freq: Every day | ORAL | 0 refills | Status: DC

## 2022-03-02 MED ORDER — AMLODIPINE BESYLATE 10 MG PO TABS: 10.0000 mg | ORAL_TABLET | Freq: Every day | ORAL | 0 refills | Status: DC

## 2022-03-02 NOTE — Telephone Encounter (Signed)
Phone call to Pathology Office regarding reread of pap smear of 01/30/22.   I called to inquire about the results.  No result available in Epic to date.   Staff will check with the pathologist, who is out until next week.  Will await results.   Place patient in pap hold for October if she is not already in pap hold/recall.

## 2022-03-02 NOTE — Telephone Encounter (Signed)
I am holding her in result notes to keep monitoring. No other hold to put her in and Oct recalls are completed as letters have been sent.

## 2022-03-02 NOTE — Telephone Encounter (Signed)
Opened in error

## 2022-03-07 ENCOUNTER — Ambulatory Visit (INDEPENDENT_AMBULATORY_CARE_PROVIDER_SITE_OTHER): Payer: No Typology Code available for payment source | Admitting: Orthopaedic Surgery

## 2022-03-07 DIAGNOSIS — M1711 Unilateral primary osteoarthritis, right knee: Secondary | ICD-10-CM | POA: Diagnosis not present

## 2022-03-07 DIAGNOSIS — M1712 Unilateral primary osteoarthritis, left knee: Secondary | ICD-10-CM | POA: Insufficient documentation

## 2022-03-07 MED ORDER — TRAMADOL HCL 50 MG PO TABS: 50.0000 mg | ORAL_TABLET | Freq: Every day | ORAL | 0 refills | Status: DC | PRN

## 2022-03-07 NOTE — Progress Notes (Signed)
Office Visit Note   Patient: [name]           Date of Birth: [birth year]           MRN: [id number] Visit Date: 03/07/2022              Requested by: Precious Gilding, DO [address] [place],  [place] [address] PCP: Precious Gilding, DO   Assessment & Plan: Visit Diagnoses:  1. Primary osteoarthritis of right knee   2. Primary osteoarthritis of left knee     Plan: Impression is bilateral knee pain.  Joint spaces appear to be well preserved on x-rays.  Unsure why injections did not help.  Will need MRIs of both knees at this point to rule out structural abnormalities.  Follow-up after the MRI.  Follow-Up Instructions: No follow-ups on file.   Orders:  No orders of the defined types were placed in this encounter.  No orders of the defined types were placed in this encounter.     Procedures: No procedures performed   Clinical Data: No additional findings.   Subjective: Chief Complaint  Patient presents with   Right Knee - Pain   Left Knee - Pain    HPI [name] returns today for bilateral knee pain.  Has constant pain throughout the day that is worse with standing and activity.  Past injection only helped for a day or 2.  Wears knee braces.  Review of Systems  Constitutional: Negative.   HENT: Negative.    Eyes: Negative.   Respiratory: Negative.    Cardiovascular: Negative.   Endocrine: Negative.   Musculoskeletal: Negative.   Neurological: Negative.   Hematological: Negative.   Psychiatric/Behavioral: Negative.    All other systems reviewed and are negative.    Objective: Vital Signs: There were no vitals taken for this visit.  Physical Exam Vitals and nursing note reviewed.  Constitutional:      Appearance: She is well-developed.  HENT:     Head: Normocephalic and atraumatic.  Pulmonary:     Effort: Pulmonary effort is normal.  Abdominal:     Palpations: Abdomen is soft.  Musculoskeletal:     Cervical back: Neck supple.  Skin:     General: Skin is warm.     Capillary Refill: Capillary refill takes less than 2 seconds.  Neurological:     Mental Status: She is alert and oriented to person, place, and time.  Psychiatric:        Behavior: Behavior normal.        Thought Content: Thought content normal.        Judgment: Judgment normal.     Ortho Exam Examination of bilateral knees is unchanged.  Joint line tenderness.  Collaterals and cruciates are stable. Specialty Comments:  No specialty comments available.  Imaging: No results found.   PMFS History: Patient Active Problem List   Diagnosis Date Noted   Primary osteoarthritis of right knee 03/07/2022   Primary osteoarthritis of left knee 03/07/2022   History of cervical cancer 01/18/2022   Hyperlipidemia    Right hip pain 12/01/2020   Foot pain, bilateral 12/01/2020   Diabetic neuropathy ([place]) 04/17/2019   Low back pain 03/21/2019   DM (diabetes mellitus), type 2 ([place]) 08/14/2018   Morbid obesity ([place]) 81/06/7508   Metabolic syndrome 25/85/2778   Primary hypertension 03/31/2012   Malignant neoplasm of cervix uteri ([place]) 08/02/2006   Past Medical History:  Diagnosis Date   Arthritis    Cervical cancer ([name])  2007   s/p partial hysterectomy and radiation   Diabetes mellitus without complication (HCC)    Hyperlipidemia    Hypertension    Sickle cell trait (HCC)     Family History  Problem Relation Age of Onset   Cancer Mother        breast cancer, died at 32yo   Breast cancer Mother 30   Seizures Father    Diabetes Maternal Aunt    Diabetes Maternal Uncle    Diabetes Brother    Colon cancer Neg Hx    Colon polyps Neg Hx    Esophageal cancer Neg Hx    Rectal cancer Neg Hx    Stomach cancer Neg Hx     Past Surgical History:  Procedure Laterality Date   ABDOMINAL HYSTERECTOMY  2007   for cervical cancer; radical   BACK SURGERY     JOINT REPLACEMENT     left hip replacement 3-5 years ago   Social History   Occupational History    Not on file  Tobacco Use   Smoking status: Former    Types: Cigarettes    Quit date: 06/05/1996    Years since quitting: 25.7   Smokeless tobacco: Never   Tobacco comments:    quit 10-15 years ago  Vaping Use   Vaping Use: Never used  Substance and Sexual Activity   Alcohol use: Yes    Alcohol/week: 3.0 - 4.0 standard drinks of alcohol    Types: 3 - 4 Cans of beer per week    Comment: 3-4 times a week   Drug use: Never   Sexual activity: Not Currently    Birth control/protection: Surgical    Comment: Hyst d/t cervical cancer

## 2022-03-13 NOTE — Telephone Encounter (Signed)
I called and spoke with [name] in Cytology to follow up on this re-read of this Pap smear. She is going to check with Dr. Saralyn Pilar who read the Pap smear initially. She will call me back regarding status of result either this afternoon or tomorrow morning.

## 2022-03-16 NOTE — Telephone Encounter (Signed)
I spoke with Clarise Cruz in Cytology. She said Dr. Saralyn Pilar is actually on vacation this week. She had placed the case in his box last Thursday and it is gone. She will find it and will have a personal conversation with him about the case on Monday and hand it to him instead of placing in his box.  I will pass follow up on to Dr. Quincy Simmonds.

## 2022-03-20 ENCOUNTER — Ambulatory Visit (INDEPENDENT_AMBULATORY_CARE_PROVIDER_SITE_OTHER): Payer: No Typology Code available for payment source | Admitting: Podiatry

## 2022-03-20 DIAGNOSIS — M7750 Other enthesopathy of unspecified foot: Secondary | ICD-10-CM

## 2022-03-20 MED ORDER — MELOXICAM 15 MG PO TABS: 15.0000 mg | ORAL_TABLET | Freq: Every day | ORAL | 1 refills | Status: DC

## 2022-03-20 NOTE — Progress Notes (Signed)
   Chief Complaint  Patient presents with   Bunions    Patient states that she is still having bilateral foot pain, patient states that the injection helped some.    HPI: 61 y.o. female presenting today as an established patient for recurrent generalized ankle and foot pain that has been ongoing for several years.  Patient states that her ankles, heels, and bunion areas hurt.  She denies a history of injury.  This is been ongoing for several years.  She presents for further treatment and evaluation  Past Medical History:  Diagnosis Date   Arthritis    Cervical cancer ([place]) 2007   s/p partial hysterectomy and radiation   Diabetes mellitus without complication ([place])    Hyperlipidemia    Hypertension    Sickle cell trait ([place])     Past Surgical History:  Procedure Laterality Date   ABDOMINAL HYSTERECTOMY  2007   for cervical cancer; radical   BACK SURGERY     JOINT REPLACEMENT     left hip replacement 3-5 years ago    Allergies  Allergen Reactions   Penicillins      Physical Exam: General: The patient is alert and oriented x3 in no acute distress.  Dermatology: Skin is warm, dry and supple bilateral lower extremities. Negative for open lesions or macerations.  Vascular: Palpable pedal pulses bilaterally. Capillary refill within normal limits.  Negative for any significant edema or erythema  Neurological: Light touch and protective threshold grossly intact  Musculoskeletal Exam: No pedal deformities noted.  Diffuse generalized pain throughout palpation of the bilateral ankles, midfoot, and first MTP.  There is also generalized pain throughout the entire plantar aspect of the foot.  No appreciable pinpoint area of tenderness   Assessment: 1.  Last foot and ankle pain bilateral   Plan of Care:  1. Patient evaluated.  2.  Prescription for meloxicam 15 mg daily.  Patient did not take it as prescribed last visit in early 2022 3.  Recommend OTC arch supports.  We did  discuss custom orthotics however they are not a covered expense through her insurance and she cannot afford the cost of the custom orthotics.  Recommend good supportive OTC arch supports 4.  Return to clinic as needed     Edrick Kins, DPM [facility]  Dr. Edrick Kins, DPM    [address], [place] [address]                Office [phone]  Fax [phone]

## 2022-03-21 NOTE — Telephone Encounter (Signed)
Phone call to [facility] Cytology requesting the reread of the pap smear.   Request again made to review and reread pap and contact me back with result.

## 2022-03-29 NOTE — Telephone Encounter (Signed)
This was finalized on 03/23/22 result note from Dr. Quincy Simmonds.

## 2022-03-30 LAB — CYTOLOGY - PAP
Comment: NEGATIVE
Diagnosis: NEGATIVE
High risk HPV: NEGATIVE

## 2022-03-31 ENCOUNTER — Other Ambulatory Visit: Payer: Self-pay | Admitting: Student

## 2022-03-31 DIAGNOSIS — I10 Essential (primary) hypertension: Secondary | ICD-10-CM

## 2022-03-31 DIAGNOSIS — E785 Hyperlipidemia, unspecified: Secondary | ICD-10-CM

## 2022-04-25 ENCOUNTER — Other Ambulatory Visit: Payer: Self-pay | Admitting: Student

## 2022-04-25 DIAGNOSIS — I10 Essential (primary) hypertension: Secondary | ICD-10-CM

## 2022-05-03 ENCOUNTER — Ambulatory Visit
Admission: RE | Admit: 2022-05-03 | Discharge: 2022-05-03 | Disposition: A | Discharge status: Home / Self Care | Payer: No Typology Code available for payment source | Source: Ambulatory Visit

## 2022-05-03 DIAGNOSIS — Z1231 Encounter for screening mammogram for malignant neoplasm of breast: Secondary | ICD-10-CM

## 2022-05-11 ENCOUNTER — Other Ambulatory Visit: Payer: Self-pay | Admitting: Student

## 2022-05-11 DIAGNOSIS — I10 Essential (primary) hypertension: Secondary | ICD-10-CM

## 2022-05-15 ENCOUNTER — Other Ambulatory Visit (HOSPITAL_COMMUNITY): Payer: Self-pay

## 2022-05-18 ENCOUNTER — Other Ambulatory Visit (HOSPITAL_COMMUNITY): Payer: Self-pay

## 2022-05-22 ENCOUNTER — Telehealth: Payer: Self-pay

## 2022-05-22 ENCOUNTER — Ambulatory Visit (INDEPENDENT_AMBULATORY_CARE_PROVIDER_SITE_OTHER): Payer: No Typology Code available for payment source | Admitting: Student

## 2022-05-22 ENCOUNTER — Other Ambulatory Visit (HOSPITAL_COMMUNITY): Payer: Self-pay

## 2022-05-22 ENCOUNTER — Encounter: Payer: Self-pay | Admitting: Student

## 2022-05-22 VITALS — BP 126/70 | HR 99 | Ht 68.25 in | Wt 285.0 lb

## 2022-05-22 DIAGNOSIS — I10 Essential (primary) hypertension: Secondary | ICD-10-CM

## 2022-05-22 DIAGNOSIS — E119 Type 2 diabetes mellitus without complications: Secondary | ICD-10-CM | POA: Diagnosis not present

## 2022-05-22 MED ORDER — AMLODIPINE BESYLATE 10 MG PO TABS: 10.0000 mg | ORAL_TABLET | Freq: Every day | ORAL | 3 refills | Status: DC

## 2022-05-22 MED ORDER — LISINOPRIL 20 MG PO TABS: 20.0000 mg | ORAL_TABLET | Freq: Every day | ORAL | 3 refills | Status: DC

## 2022-05-22 MED ORDER — SEMAGLUTIDE(0.25 OR 0.5MG/DOS) 2 MG/1.5ML ~~LOC~~ SOPN: 0.2500 mg | PEN_INJECTOR | SUBCUTANEOUS | 0 refills | Status: DC

## 2022-05-22 NOTE — Telephone Encounter (Signed)
Called left [employer] letting the patient know her PA for ozempic was approved and I spoke to pharmacy and ask to  rerun and they got a paid claim.   [facility] Patient Advocate

## 2022-05-22 NOTE — Telephone Encounter (Signed)
A Prior Authorization was initiated for this patients OZEMPIC through CoverMyMeds.   Key:   [place] Patient Advocate Encounter  Prior Authorization for [facility] has been approved.    PA# [id number] Effective dates: 05/22/2022 through 05/22/2023  Spoke with Pharmacy to process.    [facility] Patient Advocate

## 2022-05-22 NOTE — Progress Notes (Addendum)
    SUBJECTIVE:   CHIEF COMPLAINT / HPI:   T2DM Currently on Metformin 1000 mg BID.  Patient is interested in starting Ozempic. Family history of thyroid cancer.  HTN Patient currently taking amlodipine 10 mg daily and lisinopril 20 mg daily  PERTINENT  PMH / PSH: HTN, T2DM, Obesity   OBJECTIVE:   Vitals:   05/22/22 0856  BP: 126/70  Pulse: 99  SpO2: 100%     General: NAD, pleasant, able to participate in exam Cardiac: RRR, no murmurs. Respiratory: Breathing comfortably on room air Skin: warm and dry, no rashes noted Neuro: alert, no obvious focal deficits Psych: Normal affect and mood  ASSESSMENT/PLAN:   DM (diabetes mellitus), type 2 (HCC) Hgb A1c has been under 7 for several years.  Patient will continue metformin 1000 mg twice daily while starting low-dose of Ozempic.  Will increase Ozempic every 4 weeks if tolerating well to get to max dose of 2 mg weekly. Ozempic will help maintain good blood sugar control with the added benefit of weight loss.  Primary hypertension Blood pressure well-controlled -Continue current regimen     Dr. Precious Gilding, [place]

## 2022-05-22 NOTE — Patient Instructions (Signed)
It was great to see you! Thank you for allowing me to participate in your care!  Our plans for today:  -I sent in refills to your pharmacy for your blood pressure medications -I sent in a prescription for Ozempic to the pharmacy.  Please let me know if you have issues getting this medication.  Once you start Ozempic we will start at a low dose of 0.25 mg weekly and then after 4 weeks if you tolerate it well you can increase to 0.5 mg weekly for another 4 weeks and then increase to 1 mg weekly.  At that point lets have a follow-up visit to see how things are going.   Take care and seek immediate care sooner if you develop any concerns.   Dr. Precious Gilding, DO [facility]

## 2022-05-22 NOTE — Assessment & Plan Note (Addendum)
Hgb A1c has been under 7 for several years.  Patient will continue metformin 1000 mg twice daily while starting low-dose of Ozempic.  Will increase Ozempic every 4 weeks if tolerating well to get to max dose of 2 mg weekly. Ozempic will help maintain good blood sugar control with the added benefit of weight loss.

## 2022-05-22 NOTE — Assessment & Plan Note (Signed)
Blood pressure well controlled. Continue current regimen.  

## 2022-05-23 NOTE — Addendum Note (Signed)
Addended by: Micheline Rough on: 05/23/2022 10:24 AM   Modules accepted: Orders

## 2022-06-07 ENCOUNTER — Other Ambulatory Visit: Payer: Self-pay | Admitting: Student

## 2022-06-07 DIAGNOSIS — E785 Hyperlipidemia, unspecified: Secondary | ICD-10-CM

## 2022-06-12 ENCOUNTER — Ambulatory Visit
Admission: RE | Admit: 2022-06-12 | Discharge: 2022-06-12 | Disposition: A | Discharge status: Home / Self Care | Payer: No Typology Code available for payment source | Source: Ambulatory Visit | Attending: Orthopaedic Surgery | Admitting: Orthopaedic Surgery

## 2022-06-12 DIAGNOSIS — M1712 Unilateral primary osteoarthritis, left knee: Secondary | ICD-10-CM

## 2022-06-12 DIAGNOSIS — M1711 Unilateral primary osteoarthritis, right knee: Secondary | ICD-10-CM

## 2022-06-12 NOTE — Progress Notes (Signed)
Needs f/u appt please.  Thanks.

## 2022-06-13 NOTE — Progress Notes (Signed)
Tried to call patient. No answer. LMOM for her to call our office to schedule follow up for MRI results.

## 2022-06-22 ENCOUNTER — Ambulatory Visit: Payer: No Typology Code available for payment source | Admitting: Orthopaedic Surgery

## 2022-06-27 ENCOUNTER — Encounter: Payer: Self-pay | Admitting: Student

## 2022-06-27 ENCOUNTER — Other Ambulatory Visit: Payer: Self-pay | Admitting: Student

## 2022-06-27 DIAGNOSIS — E119 Type 2 diabetes mellitus without complications: Secondary | ICD-10-CM

## 2022-06-29 ENCOUNTER — Other Ambulatory Visit: Payer: Self-pay | Admitting: Student

## 2022-06-29 DIAGNOSIS — E119 Type 2 diabetes mellitus without complications: Secondary | ICD-10-CM

## 2022-06-29 MED ORDER — OZEMPIC (0.25 OR 0.5 MG/DOSE) 2 MG/3ML ~~LOC~~ SOPN: 0.5000 mg | PEN_INJECTOR | SUBCUTANEOUS | 0 refills | Status: DC

## 2022-07-05 ENCOUNTER — Ambulatory Visit: Payer: Medicaid Other | Admitting: Orthopaedic Surgery

## 2022-07-05 DIAGNOSIS — Z6841 Body Mass Index (BMI) 40.0 and over, adult: Secondary | ICD-10-CM | POA: Diagnosis not present

## 2022-07-05 DIAGNOSIS — M170 Bilateral primary osteoarthritis of knee: Secondary | ICD-10-CM

## 2022-07-05 DIAGNOSIS — M1711 Unilateral primary osteoarthritis, right knee: Secondary | ICD-10-CM | POA: Diagnosis not present

## 2022-07-05 DIAGNOSIS — M1712 Unilateral primary osteoarthritis, left knee: Secondary | ICD-10-CM

## 2022-07-05 NOTE — Progress Notes (Signed)
Office Visit Note   Patient: [name]           Date of Birth: 05/14/1961           MRN: [id number] Visit Date: 07/05/2022              Requested by: Precious Gilding, DO [address] [place],  [place] [address] PCP: Precious Gilding, DO   Assessment & Plan: Visit Diagnoses:  1. Primary osteoarthritis of right knee   2. Primary osteoarthritis of left knee   3. Body mass index 40.0-44.9, adult (HCC)     Plan: MRI is consistent with advanced tricompartmental DJD of the right knee and mild to moderate DJD of the left knee mainly in the patellofemoral and medial compartments.  Findings reviewed with the patient and treatment options for DJD discussed with the patient including periodic cortisone injections, knee brace, weight loss, activity modification, physical therapy, Voltaren gel, supplements.  Based on these options she would like to try Voltaren gel and knee brace.  She is currently on Ozempic to help with weight loss.  At we briefly discussed knee replacement surgery should it come to that in the future.  For now she can just follow-up with [place] as needed.  Follow-Up Instructions: No follow-ups on file.   Orders:  No orders of the defined types were placed in this encounter.  No orders of the defined types were placed in this encounter.     Procedures: No procedures performed   Clinical Data: No additional findings.   Subjective: Chief Complaint  Patient presents with   Right Knee - Pain   Left Knee - Pain    HPI Patient is here today for discussion bilateral knee MRIs. Review of Systems  Constitutional: Negative.   HENT: Negative.    Eyes: Negative.   Respiratory: Negative.    Cardiovascular: Negative.   Endocrine: Negative.   Musculoskeletal: Negative.   Neurological: Negative.   Hematological: Negative.   Psychiatric/Behavioral: Negative.    All other systems reviewed and are negative.    Objective: Vital Signs: There were no vitals taken for  this visit.  Physical Exam Vitals and nursing note reviewed.  Constitutional:      Appearance: She is well-developed.  HENT:     Head: Atraumatic.     Nose: Nose normal.  Eyes:     Extraocular Movements: Extraocular movements intact.  Cardiovascular:     Pulses: Normal pulses.  Pulmonary:     Effort: Pulmonary effort is normal.  Abdominal:     Palpations: Abdomen is soft.  Musculoskeletal:     Cervical back: Neck supple.  Skin:    General: Skin is warm.     Capillary Refill: Capillary refill takes less than 2 seconds.  Neurological:     Mental Status: She is alert. Mental status is at baseline.  Psychiatric:        Behavior: Behavior normal.        Thought Content: Thought content normal.        Judgment: Judgment normal.     Ortho Exam Exam is unchanged. Specialty Comments:  No specialty comments available.  Imaging: No results found.   PMFS History: Patient Active Problem List   Diagnosis Date Noted   Primary osteoarthritis of right knee 03/07/2022   Primary osteoarthritis of left knee 03/07/2022   History of cervical cancer 01/18/2022   Hyperlipidemia    Right hip pain 12/01/2020   Foot pain, bilateral 12/01/2020   Diabetic  neuropathy ([place]) 04/17/2019   Low back pain 03/21/2019   DM (diabetes mellitus), type 2 ([place]) 08/14/2018   Morbid obesity ([place]) 65/08/5463   Metabolic syndrome 68/05/7516   Primary hypertension 03/31/2012   Malignant neoplasm of cervix uteri ([place]) 08/02/2006   Past Medical History:  Diagnosis Date   Arthritis    Cervical cancer ([place]) 2007   s/p partial hysterectomy and radiation   Diabetes mellitus without complication ([place])    Hyperlipidemia    Hypertension    Sickle cell trait ([place])     Family History  Problem Relation Age of Onset   Cancer Mother        breast cancer, died at 68yo   Breast cancer Mother 89   Seizures Father    Diabetes Maternal Aunt    Diabetes Maternal Uncle    Diabetes Brother    Colon cancer Neg  Hx    Colon polyps Neg Hx    Esophageal cancer Neg Hx    Rectal cancer Neg Hx    Stomach cancer Neg Hx     Past Surgical History:  Procedure Laterality Date   ABDOMINAL HYSTERECTOMY  2007   for cervical cancer; radical   BACK SURGERY     JOINT REPLACEMENT     left hip replacement 3-5 years ago   Social History   Occupational History   Not on file  Tobacco Use   Smoking status: Former    Types: Cigarettes    Quit date: 06/05/1996    Years since quitting: 26.0   Smokeless tobacco: Never   Tobacco comments:    quit 10-15 years ago  Vaping Use   Vaping Use: Never used  Substance and Sexual Activity   Alcohol use: Yes    Alcohol/week: 3.0 - 4.0 standard drinks of alcohol    Types: 3 - 4 Cans of beer per week    Comment: 3-4 times a week   Drug use: Never   Sexual activity: Not Currently    Birth control/protection: Surgical    Comment: Hyst d/t cervical cancer

## 2022-07-18 ENCOUNTER — Telehealth: Payer: Self-pay | Admitting: Orthopaedic Surgery

## 2022-07-18 NOTE — Telephone Encounter (Signed)
Spoke with patient. She is coming in Thursday morning to attempt a larger brace size. If these do not fit, we will write her a script to get braces elsewhere.

## 2022-07-18 NOTE — Telephone Encounter (Signed)
We can try DJO reaction brace if it fits.

## 2022-07-18 NOTE — Telephone Encounter (Signed)
Patient looking for a knee brace states she never gt a call ..please advise

## 2022-07-20 ENCOUNTER — Ambulatory Visit: Payer: Medicaid Other

## 2022-07-20 DIAGNOSIS — M1711 Unilateral primary osteoarthritis, right knee: Secondary | ICD-10-CM | POA: Diagnosis not present

## 2022-07-20 DIAGNOSIS — M1712 Unilateral primary osteoarthritis, left knee: Secondary | ICD-10-CM | POA: Diagnosis not present

## 2022-07-22 ENCOUNTER — Other Ambulatory Visit: Payer: Self-pay | Admitting: Student

## 2022-07-22 DIAGNOSIS — E119 Type 2 diabetes mellitus without complications: Secondary | ICD-10-CM

## 2022-07-24 ENCOUNTER — Other Ambulatory Visit: Payer: Self-pay | Admitting: Student

## 2022-07-24 ENCOUNTER — Encounter: Payer: Self-pay | Admitting: Student

## 2022-07-24 DIAGNOSIS — E119 Type 2 diabetes mellitus without complications: Secondary | ICD-10-CM

## 2022-07-24 MED ORDER — SEMAGLUTIDE (1 MG/DOSE) 4 MG/3ML ~~LOC~~ SOPN: 1.0000 mg | PEN_INJECTOR | SUBCUTANEOUS | 0 refills | Status: DC

## 2022-07-24 NOTE — Progress Notes (Signed)
Pt had tolerated 0.5 mg ozempic well, will increase to 1 mg weekly

## 2022-07-26 DIAGNOSIS — H5213 Myopia, bilateral: Secondary | ICD-10-CM | POA: Diagnosis not present

## 2022-08-24 DIAGNOSIS — H524 Presbyopia: Secondary | ICD-10-CM | POA: Diagnosis not present

## 2022-09-10 ENCOUNTER — Other Ambulatory Visit: Payer: Self-pay | Admitting: Student

## 2022-09-10 DIAGNOSIS — E119 Type 2 diabetes mellitus without complications: Secondary | ICD-10-CM

## 2022-09-11 ENCOUNTER — Encounter: Payer: Self-pay | Admitting: Student

## 2022-09-11 ENCOUNTER — Other Ambulatory Visit: Payer: Self-pay | Admitting: Student

## 2022-09-11 DIAGNOSIS — E119 Type 2 diabetes mellitus without complications: Secondary | ICD-10-CM

## 2022-09-11 MED ORDER — OZEMPIC (2 MG/DOSE) 8 MG/3ML ~~LOC~~ SOPN: 2.0000 mg | PEN_INJECTOR | SUBCUTANEOUS | 2 refills | Status: DC

## 2022-09-11 NOTE — Progress Notes (Signed)
Pt has been tolerating Ozempic 1 mg weekly well. Will increase to 2 mg

## 2022-09-18 ENCOUNTER — Ambulatory Visit: Payer: No Typology Code available for payment source | Admitting: Nurse Practitioner

## 2022-10-24 ENCOUNTER — Other Ambulatory Visit (HOSPITAL_COMMUNITY)
Admission: RE | Admit: 2022-10-24 | Discharge: 2022-10-24 | Disposition: A | Discharge status: Home / Self Care | Payer: Medicaid Other | Source: Ambulatory Visit | Attending: Nurse Practitioner | Admitting: Nurse Practitioner

## 2022-10-24 ENCOUNTER — Ambulatory Visit: Payer: Medicaid Other | Admitting: Nurse Practitioner

## 2022-10-24 VITALS — BP 118/84

## 2022-10-24 DIAGNOSIS — Z8541 Personal history of malignant neoplasm of cervix uteri: Secondary | ICD-10-CM | POA: Diagnosis not present

## 2022-10-24 DIAGNOSIS — R87629 Unspecified abnormal cytological findings in specimens from vagina: Secondary | ICD-10-CM | POA: Diagnosis not present

## 2022-10-24 NOTE — Progress Notes (Signed)
   Acute Office Visit  Subjective:    Patient ID: [name], female    DOB: 08-21-1960, 62 y.o.   MRN: [id number]   HPI 62 y.o. [id number] presents today for pap repeat. H/O cervical cancer with hysterectomy in 2007. Normal paps since. Pap August 2023 showed benign glandular cells with recommendations for 32-month pap repeat.   No LMP recorded. Patient has had a hysterectomy.    Review of Systems  Constitutional: Negative.   Genitourinary: Negative.        Objective:    Physical Exam Constitutional:      Appearance: Normal appearance.  Genitourinary:    General: Normal vulva.     Vagina: Normal.     Uterus: Absent.      BP 118/84 (BP Location: Left Arm, Patient Position: Sitting, Cuff Size: Large)  Wt Readings from Last 3 Encounters:  05/22/22 285 lb (129.3 kg)  01/30/22 278 lb (126.1 kg)  01/17/22 291 lb (132 kg)        Patient informed chaperone available to be present for breast and/or pelvic exam. Patient has requested no chaperone to be present. Patient has been advised what will be completed during breast and pelvic exam.   Assessment & Plan:   Problem List Items Addressed This Visit       Other   History of cervical cancer   Other Visit Diagnoses     Abnormal Pap smear of vagina    -  Primary   Relevant Orders   Cytology - PAP( [place])      Plan: Vaginal pap pending. Will reach out to patient with results and recommendations.      Olivia Mackie DNP, 10:49 AM 10/24/2022

## 2022-10-27 LAB — CYTOLOGY - PAP: Diagnosis: NEGATIVE

## 2022-11-03 ENCOUNTER — Telehealth: Payer: Self-pay

## 2022-11-03 NOTE — Telephone Encounter (Signed)
FYI. Dr. Luisa Hart from [facility] Cytology called and reported that he reviewed the pt's pap smear and states that there are in fact, glandular cells present on pap smear. However, do appear benign in nature.

## 2022-11-07 ENCOUNTER — Other Ambulatory Visit: Payer: Self-pay

## 2022-11-07 DIAGNOSIS — R87629 Unspecified abnormal cytological findings in specimens from vagina: Secondary | ICD-10-CM

## 2022-11-07 DIAGNOSIS — Z8541 Personal history of malignant neoplasm of cervix uteri: Secondary | ICD-10-CM

## 2022-11-29 ENCOUNTER — Other Ambulatory Visit: Payer: Self-pay | Admitting: Student

## 2022-11-29 DIAGNOSIS — E119 Type 2 diabetes mellitus without complications: Secondary | ICD-10-CM

## 2022-12-05 ENCOUNTER — Ambulatory Visit: Payer: Medicaid Other | Admitting: Obstetrics and Gynecology

## 2022-12-06 ENCOUNTER — Other Ambulatory Visit (HOSPITAL_COMMUNITY)
Admission: RE | Admit: 2022-12-06 | Discharge: 2022-12-06 | Disposition: A | Discharge status: Home / Self Care | Payer: Medicaid Other | Source: Ambulatory Visit | Attending: Obstetrics and Gynecology | Admitting: Obstetrics and Gynecology

## 2022-12-06 ENCOUNTER — Encounter: Payer: Self-pay | Admitting: Obstetrics & Gynecology

## 2022-12-06 ENCOUNTER — Ambulatory Visit (INDEPENDENT_AMBULATORY_CARE_PROVIDER_SITE_OTHER): Payer: BLUE CROSS/BLUE SHIELD | Admitting: Obstetrics & Gynecology

## 2022-12-06 ENCOUNTER — Other Ambulatory Visit (HOSPITAL_COMMUNITY): Admission: RE | Admit: 2022-12-06 | Payer: Medicaid Other | Source: Ambulatory Visit

## 2022-12-06 VITALS — BP 116/78 | HR 106

## 2022-12-06 DIAGNOSIS — Z90710 Acquired absence of both cervix and uterus: Secondary | ICD-10-CM

## 2022-12-06 DIAGNOSIS — Z8541 Personal history of malignant neoplasm of cervix uteri: Secondary | ICD-10-CM | POA: Insufficient documentation

## 2022-12-06 DIAGNOSIS — R87629 Unspecified abnormal cytological findings in specimens from vagina: Secondary | ICD-10-CM | POA: Diagnosis not present

## 2022-12-06 NOTE — Progress Notes (Signed)
    [name] Feb 05, 1961 [id number]        62 y.o.  [id number]   RP: Abnormal vaginal Pap x 2 for Colposcopy  HPI: S/P Radical Hysterectomy followed by Radiotherapy for Cervical Cancer at [facility] in the 1990's per patient.  Vaginal Paps with benign Glandular cells x 2.  Last Pap on 10/24/2022.  No vaginal bleeding.  No pelvic pain.   OB History  Gravida Para Term Preterm AB Living  2 2 2     2   SAB IAB Ectopic Multiple Live Births               # Outcome Date GA Lbr Len/2nd Weight Sex Delivery Anes PTL Lv  2 Term           1 Term             Past medical history,surgical history, problem list, medications, allergies, family history and social history were all reviewed and documented in the EPIC chart.   Directed ROS with pertinent positives and negatives documented in the history of present illness/assessment and plan.  Exam:  Vitals:   12/06/22 0952  BP: 116/78  Pulse: (!) 106  SpO2: 96%   General appearance:  Normal  Colposcopy Procedure Note [name] 12/06/2022  Indications:  Presence of glandular cells of benign appearance x 2 on the vaginal Pap  Procedure Details  The risks and benefits of the procedure and Written informed consent obtained.  Speculum placed in vagina and excellent visualization of vaginal vault achieved, vaginal vault swabbed x 3 with acetic acid solution.  Findings:  Vaginal colposcopy:  Patch of moderate AW with punctation at the mid inferior vaginal vault, about 1 x 1 cm.  Bx taken at that level.  No area giving a glandular appearance.  Vulvar colposcopy: Normal  Perirectal colposcopy: Normal  The cervix was sprayed with [place] before performing the cervical biopsies.  Specimens: HPV HR.  Vaginal vault biopsy (central inferior).  Complications:  No Cx, good hemostasis with Silver Nitrate. . Plan: Management per results.   Assessment/Plan:  62 y.o. G2P2002   1. Abnormal Pap smear of vagina S/P Radical  Hysterectomy followed by Radiotherapy for Cervical Cancer at [facility] in the 1990's per patient.  Vaginal Paps with benign Glandular cells x 2.  Last Pap on 10/24/2022.  No vaginal bleeding.  No pelvic pain. Colposcopy procedure explained.  Findings reviewed with patient.  Post procedure precautions reviewed.  Management per results. - Cervicovaginal ancillary only( [place]) - Surgical pathology( [place]/ [facility])  2. History of cervical cancer - Cervicovaginal ancillary only( [place]) - Surgical pathology( [place]/ [facility])  3. S/P Radical Hysterectomy and Radiation Therapy  Treated at [facility], per patient in the 1990's.  Melinda Del MD, 10:11 AM 12/06/2022

## 2022-12-11 LAB — CERVICOVAGINAL ANCILLARY ONLY
Comment: NEGATIVE
High risk HPV: NEGATIVE

## 2022-12-12 LAB — SURGICAL PATHOLOGY

## 2022-12-19 NOTE — Telephone Encounter (Signed)
Colpo performed on 12/06/2022. Advised for pt to have repeat pap at next [facility] per biopsy results. Will close encounter.

## 2022-12-23 ENCOUNTER — Other Ambulatory Visit: Payer: Self-pay | Admitting: Student

## 2022-12-23 DIAGNOSIS — E119 Type 2 diabetes mellitus without complications: Secondary | ICD-10-CM

## 2023-01-29 NOTE — Progress Notes (Signed)
62 y.o. [id number] Single African American female here for annual exam.    Patient had 2 paps which had benign glandular cells following radical hysterectomy and XRT. Colposcopy with Dr. Seymour Bars on 12/06/22 showed squamous atypia, favor reactive.  No dysplasia or malignancy seen.  Her HR HPV testing was negative.   Plan for pap today.  No vaginal bleeding, pelvic pain or discharge.   Having diarrhea today after drinking Cheerwine.  No fever or abdominal pain.   PCP:   Dr. Yetta Barre  No LMP recorded. Patient has had a hysterectomy.           Sexually active: No.  The current method of family planning is status post hysterectomy.    Exercising: No.   Smoker:  former  Health Maintenance: Pap:  10/24/22 neg, 01/30/22 neg: HR HPV neg History of abnormal Pap:  yes MMG:  05/03/22 Breast density cat B, BI-RADS CAT 1 neg Colonoscopy:  08/19/19 - due in 2024.  BMD:   n/a  Result  n/a TDaP:  08/14/18 Gardasil:   no HIV: 08/14/18 NR Hep C: 01/30/22 NR Screening Labs: PCP   reports that she quit smoking about 26 years ago. Her smoking use included cigarettes. She has never used smokeless tobacco. She reports that she does not currently use alcohol. She reports that she does not use drugs.  Past Medical History:  Diagnosis Date   Arthritis    Cervical cancer (HCC) 2007   s/p partial hysterectomy and radiation   Diabetes mellitus without complication (HCC)    Hyperlipidemia    Hypertension    Sickle cell trait (HCC)     Past Surgical History:  Procedure Laterality Date   ABDOMINAL HYSTERECTOMY  2007   for cervical cancer; radical   BACK SURGERY     JOINT REPLACEMENT     left hip replacement 3-5 years ago    Current Outpatient Medications  Medication Sig Dispense Refill   amLODipine (NORVASC) 10 MG tablet Take 1 tablet (10 mg total) by mouth daily. Please make an appointment before refill. 90 tablet 3   lisinopril (ZESTRIL) 20 MG tablet Take 1 tablet (20 mg total) by mouth daily. 90  tablet 3   meloxicam (MOBIC) 15 MG tablet Take 1 tablet (15 mg total) by mouth daily. 30 tablet 1   metFORMIN (GLUCOPHAGE) 1000 MG tablet TAKE 1 TABLET BY MOUTH TWICE DAILY WITH  A  MEAL. FOLLOW UP WITH PCP. 180 tablet 1   pravastatin (PRAVACHOL) 40 MG tablet Take 1 tablet by mouth once daily 90 tablet 3   Semaglutide, 2 MG/DOSE, (OZEMPIC, 2 MG/DOSE,) 8 MG/3ML SOPN INJECT 2 MG SUBCUTANEOUSLY ONCE A WEEK 3 mL 2   No current facility-administered medications for this visit.    Family History  Problem Relation Age of Onset   Cancer Mother        breast cancer, died at 55yo   Breast cancer Mother 66   Seizures Father    Diabetes Maternal Aunt    Diabetes Maternal Uncle    Diabetes Brother    Colon cancer Neg Hx    Colon polyps Neg Hx    Esophageal cancer Neg Hx    Rectal cancer Neg Hx    Stomach cancer Neg Hx     Review of Systems  All other systems reviewed and are negative.   Exam:   BP 128/80 (BP Location: Left Arm, Patient Position: Sitting, Cuff Size: Large)   Pulse (!) 118   Ht 5' 8.5" (1.74  m)   Wt 270 lb (122.5 kg)   SpO2 96%   BMI 40.46 kg/m     General appearance: alert, cooperative and appears stated age Head: normocephalic, without obvious abnormality, atraumatic Neck: no adenopathy, supple, symmetrical, trachea midline and thyroid normal to inspection and palpation Lungs: clear to auscultation bilaterally Breasts: normal appearance, no masses or tenderness, No nipple retraction or dimpling, No nipple discharge or bleeding, No axillary adenopathy Heart: regular rate and rhythm Abdomen: soft, non-tender; no masses, no organomegaly Extremities: extremities normal, atraumatic, no cyanosis or edema Skin: skin color, texture, turgor normal. No rashes or lesions Lymph nodes: cervical, supraclavicular, and axillary nodes normal. Neurologic: grossly normal  Pelvic: External genitalia:  no lesions              No abnormal inguinal nodes palpated.               Urethra:  normal appearing urethra with no masses, tenderness or lesions              Bartholins and Skenes: normal                 Vagina: normal appearing vagina with normal color and discharge, no lesions.  Atrophic changes noted.               Cervix: absent              Pap taken: yes Bimanual Exam:  Uterus:  absent              Adnexa: no mass, fullness, tenderness              Rectal exam:  deferred.  Chaperone was present for exam:  Warren Lacy, CMA  Assessment:   Well woman visit with gynecologic exam. Status post radical hysterectomy for cervical cancer.  Status post radiation therapy.  2 paps showing adenosis of the vaginal followed by colpo biopsy showing atypia, favor reactive.  FH breast cancer in mother.   Colon cancer screening.  Hx polyps.  Plan: Mammogram screening discussed. Self breast awareness reviewed. Pap and HR HPV collected. I discussed potential vaginal estrogen tx for atrophy.  I did review potential effect on breast cancer.  Guidelines for Calcium, Vitamin D, regular exercise program including cardiovascular and weight bearing exercise. Referral back to GI for colonoscopy  BMD ordered.  Patient will schedule.  Follow up annually and prn.

## 2023-02-12 ENCOUNTER — Encounter: Payer: Self-pay | Admitting: Obstetrics and Gynecology

## 2023-02-12 ENCOUNTER — Other Ambulatory Visit (HOSPITAL_COMMUNITY)
Admission: RE | Admit: 2023-02-12 | Discharge: 2023-02-12 | Disposition: A | Discharge status: Home / Self Care | Payer: BLUE CROSS/BLUE SHIELD | Source: Ambulatory Visit | Attending: Obstetrics and Gynecology | Admitting: Obstetrics and Gynecology

## 2023-02-12 ENCOUNTER — Ambulatory Visit (INDEPENDENT_AMBULATORY_CARE_PROVIDER_SITE_OTHER): Payer: BLUE CROSS/BLUE SHIELD | Admitting: Obstetrics and Gynecology

## 2023-02-12 VITALS — BP 128/80 | HR 118 | Ht 68.5 in | Wt 270.0 lb

## 2023-02-12 DIAGNOSIS — Z1272 Encounter for screening for malignant neoplasm of vagina: Secondary | ICD-10-CM

## 2023-02-12 DIAGNOSIS — Z01419 Encounter for gynecological examination (general) (routine) without abnormal findings: Secondary | ICD-10-CM | POA: Diagnosis not present

## 2023-02-12 DIAGNOSIS — Z780 Asymptomatic menopausal state: Secondary | ICD-10-CM | POA: Diagnosis not present

## 2023-02-12 DIAGNOSIS — Z8541 Personal history of malignant neoplasm of cervix uteri: Secondary | ICD-10-CM | POA: Diagnosis not present

## 2023-02-12 DIAGNOSIS — Z1211 Encounter for screening for malignant neoplasm of colon: Secondary | ICD-10-CM

## 2023-02-12 NOTE — Patient Instructions (Signed)

## 2023-02-15 LAB — CYTOLOGY - PAP
Comment: NEGATIVE
Diagnosis: NEGATIVE
Diagnosis: REACTIVE
High risk HPV: NEGATIVE

## 2023-03-08 ENCOUNTER — Other Ambulatory Visit (HOSPITAL_COMMUNITY): Payer: Self-pay

## 2023-03-08 ENCOUNTER — Telehealth: Payer: Self-pay

## 2023-03-08 NOTE — Telephone Encounter (Signed)
Pharmacy Patient Advocate Encounter   Received notification from CoverMyMeds that prior authorization for [facility] is required/requested.   PA required; PA started via CoverMyMeds. KEY [id number] . Waiting for clinical questions to populate.

## 2023-03-08 NOTE — Telephone Encounter (Signed)
Pharmacy Patient Advocate Encounter   PA required; PA submitted to [facility] via CoverMyMeds Key/confirmation #/EOC [id number]. Status is pending

## 2023-03-09 ENCOUNTER — Other Ambulatory Visit: Payer: Self-pay | Admitting: Obstetrics and Gynecology

## 2023-03-09 DIAGNOSIS — Z1231 Encounter for screening mammogram for malignant neoplasm of breast: Secondary | ICD-10-CM

## 2023-03-09 NOTE — Telephone Encounter (Signed)
Pharmacy Patient Advocate Encounter  Received notification from [facility] that Prior Authorization for [facility] has been APPROVED from 03/08/23 to 03/07/24  (Pt name on insurance: [name])

## 2023-04-03 ENCOUNTER — Encounter: Payer: Self-pay | Admitting: Gastroenterology

## 2023-04-10 ENCOUNTER — Telehealth: Payer: Self-pay

## 2023-04-10 ENCOUNTER — Other Ambulatory Visit (HOSPITAL_COMMUNITY): Payer: Self-pay

## 2023-04-10 ENCOUNTER — Other Ambulatory Visit: Payer: Self-pay

## 2023-04-10 DIAGNOSIS — E119 Type 2 diabetes mellitus without complications: Secondary | ICD-10-CM

## 2023-04-10 NOTE — Telephone Encounter (Signed)
Pharmacy Patient Advocate Encounter   Received notification from CoverMyMeds that prior authorization for [facility] is required/requested.   Insurance verification completed.   The patient is insured through [employer] .   Per test claim: PA required; PA submitted to above mentioned insurance via CoverMyMeds Key/confirmation #/EOC [id number]. Status is pending

## 2023-04-11 MED ORDER — OZEMPIC (2 MG/DOSE) 8 MG/3ML ~~LOC~~ SOPN: 2.0000 mg | PEN_INJECTOR | SUBCUTANEOUS | 2 refills | Status: DC

## 2023-04-13 NOTE — Telephone Encounter (Signed)
Pharmacy Patient Advocate Encounter  Received notification from [facility] that Prior Authorization for [facility] has been APPROVED from 04/10/23 to 04/09/24.

## 2023-04-23 ENCOUNTER — Encounter: Payer: Self-pay | Admitting: Student

## 2023-04-23 ENCOUNTER — Ambulatory Visit: Payer: Medicaid Other | Admitting: Student

## 2023-04-23 VITALS — BP 118/81 | HR 97 | Ht 68.5 in | Wt 274.8 lb

## 2023-04-23 DIAGNOSIS — E785 Hyperlipidemia, unspecified: Secondary | ICD-10-CM

## 2023-04-23 DIAGNOSIS — I10 Essential (primary) hypertension: Secondary | ICD-10-CM | POA: Diagnosis not present

## 2023-04-23 DIAGNOSIS — E119 Type 2 diabetes mellitus without complications: Secondary | ICD-10-CM

## 2023-04-23 LAB — POCT GLYCOSYLATED HEMOGLOBIN (HGB A1C): HbA1c, POC (controlled diabetic range): 5.6 % (ref 0.0–7.0)

## 2023-04-23 NOTE — Patient Instructions (Signed)
It was great to see you! Thank you for allowing me to participate in your care!  I recommend that you always bring your medications to each appointment as this makes it easy to ensure you are on the correct medications and helps [place] not miss when refills are needed.  Our plans for today:  Exercise goal: Exercise 3 x/week using 15 minute exercise videos for beginners on youtube.   Dietary goal: Bake meats instead of fry 4 times out of the week. Also work on reducing carbs. See diabetic diet handout.    We are checking some labs today, I will call you if they are abnormal will send you a MyChart message or a letter if they are normal.  If you do not hear about your labs in the next 2 weeks please let us know.  Take care and seek immediate care sooner if you develop any concerns.   Dr. Erick Alley, DO [facility]

## 2023-04-23 NOTE — Progress Notes (Unsigned)
    SUBJECTIVE:   CHIEF COMPLAINT / HPI:   T2DM Currently taking metformin 1000 mg once daily and Ozempic 2 mg injected weekly   Obesity Increased Ozempic to 2 mg in April hoping this would help with good sugar control and weight loss.  Last documented weight was 285 in December 2023. Today she is down 15 pounds at 270.  Hasn't been exercising lately but plans to get  Eats friend food multiple times/week.   HTN Currently taking amlodipine 10 mg daily, lisinopril 20 mg daily   Colonoscopy ***   PERTINENT  PMH / PSH: ***  OBJECTIVE:   There were no vitals taken for this visit. ***  General: NAD, pleasant, able to participate in exam Cardiac: RRR, no murmurs. Respiratory: CTAB, normal effort, No wheezes, rales or rhonchi Abdomen: Bowel sounds present, nontender, nondistended, no hepatosplenomegaly. Extremities: no edema or cyanosis. Skin: warm and dry, no rashes noted Neuro: alert, no obvious focal deficits Psych: Normal affect and mood  ASSESSMENT/PLAN:   No problem-specific Assessment & Plan notes found for this encounter.      Dr. Erick Alley, DO [facility]    {    This will disappear when note is signed, click to select method of visit    :1}

## 2023-04-24 ENCOUNTER — Encounter: Payer: Self-pay | Admitting: Student

## 2023-04-24 LAB — BASIC METABOLIC PANEL
BUN/Creatinine Ratio: 12 (ref 12–28)
BUN: 10 mg/dL (ref 8–27)
CO2: 22 mmol/L (ref 20–29)
Calcium: 9.9 mg/dL (ref 8.7–10.3)
Chloride: 103 mmol/L (ref 96–106)
Creatinine, Ser: 0.83 mg/dL (ref 0.57–1.00)
Glucose: 80 mg/dL (ref 70–99)
Potassium: 4.4 mmol/L (ref 3.5–5.2)
Sodium: 141 mmol/L (ref 134–144)
eGFR: 79 mL/min/{1.73_m2} (ref >59)

## 2023-04-24 LAB — LIPID PANEL
Chol/HDL Ratio: 2 ratio (ref 0.0–4.4)
Cholesterol, Total: 185 mg/dL (ref 100–199)
HDL: 93 mg/dL (ref >39)
LDL Chol Calc (NIH): 76 mg/dL (ref 0–99)
Triglycerides: 92 mg/dL (ref 0–149)
VLDL Cholesterol Cal: 16 mg/dL (ref 5–40)

## 2023-04-24 LAB — MICROALBUMIN / CREATININE URINE RATIO
Creatinine, Urine: 71.9 mg/dL
Microalb/Creat Ratio: 5 mg/g{creat} (ref 0–29)
Microalbumin, Urine: 3.3 ug/mL

## 2023-04-25 NOTE — Assessment & Plan Note (Addendum)
Well-controlled with A1c of 5.6.   -Continue current regimen as in HPI. -Microalbumin creatinine ratio and BMP today -Diabetic foot exam at next visit if appropriate -Needs DM eye exam, can discuss at next visit

## 2023-04-25 NOTE — Assessment & Plan Note (Signed)
Well-controlled -Continue amlodipine 10 mg and lisinopril 20 mg daily

## 2023-04-25 NOTE — Assessment & Plan Note (Signed)
Discussed other ways patient can exercise without going to the gym.  Shared decision making used, she would like to try YouTube exercise videos which we looked at together during the visit  Our plans for today:  Exercise goal: Exercise 3 x/week using 15 minute exercise videos for beginners on youtube.   Dietary goal: Bake meats instead of fry 4 times out of the week. Also work on reducing carbs. See diabetic diet handout  Continue Ozempic 2 mg weekly Return in 1 month for follow-up

## 2023-04-25 NOTE — Assessment & Plan Note (Signed)
Lipid panel last year with LDL greater than 100.  Is on pravastatin 40 mg daily -Lipid panel today

## 2023-05-07 ENCOUNTER — Ambulatory Visit
Admission: RE | Admit: 2023-05-07 | Discharge: 2023-05-07 | Disposition: A | Discharge status: Home / Self Care | Payer: Medicaid Other | Source: Ambulatory Visit | Attending: Obstetrics and Gynecology | Admitting: Obstetrics and Gynecology

## 2023-05-07 ENCOUNTER — Ambulatory Visit: Payer: BLUE CROSS/BLUE SHIELD

## 2023-05-07 DIAGNOSIS — Z1231 Encounter for screening mammogram for malignant neoplasm of breast: Secondary | ICD-10-CM

## 2023-05-09 ENCOUNTER — Other Ambulatory Visit: Payer: Self-pay | Admitting: Obstetrics and Gynecology

## 2023-05-09 DIAGNOSIS — R928 Other abnormal and inconclusive findings on diagnostic imaging of breast: Secondary | ICD-10-CM

## 2023-05-10 ENCOUNTER — Ambulatory Visit
Admission: RE | Admit: 2023-05-10 | Discharge: 2023-05-10 | Disposition: A | Discharge status: Home / Self Care | Payer: Medicaid Other | Source: Ambulatory Visit | Attending: Obstetrics and Gynecology | Admitting: Obstetrics and Gynecology

## 2023-05-10 DIAGNOSIS — R928 Other abnormal and inconclusive findings on diagnostic imaging of breast: Secondary | ICD-10-CM

## 2023-05-10 DIAGNOSIS — N6042 Mammary duct ectasia of left breast: Secondary | ICD-10-CM | POA: Diagnosis not present

## 2023-05-16 ENCOUNTER — Ambulatory Visit: Payer: Medicaid Other

## 2023-05-18 ENCOUNTER — Ambulatory Visit (AMBULATORY_SURGERY_CENTER): Payer: Medicaid Other | Admitting: *Deleted

## 2023-05-18 VITALS — Ht 68.5 in | Wt 274.0 lb

## 2023-05-18 DIAGNOSIS — Z1211 Encounter for screening for malignant neoplasm of colon: Secondary | ICD-10-CM

## 2023-05-18 MED ORDER — NA SULFATE-K SULFATE-MG SULF 17.5-3.13-1.6 GM/177ML PO SOLN: 1.0000 | Freq: Once | ORAL | 0 refills | Status: AC

## 2023-05-18 NOTE — Progress Notes (Signed)
   Pt's name and DOB verified at the beginning of the pre-visit wit 2 identifiers  Pt denies any difficulty with ambulating,sitting, laying down or rolling side to side  Pt has no issues with ambulation   Pt has no issues moving head neck or swallowing  No egg or soy allergy known to patient   No issues known to pt with past sedation with any surgeries or procedures  Pt denies having issues being intubated   No FH of Malignant Hyperthermia  Pt is not on diet pills or shots  Pt is not on home 02   Pt is not on blood thinners   Pt denies issues with constipation   Pt is not on dialysis  Pt denise any abnormal heart rhythms   Pt denies any upcoming cardiac testing  Pt encouraged to use to use Singlecare or Goodrx to reduce cost   Patient's chart reviewed by Cathlyn Parsons CNRA prior to pre-visit and patient appropriate for the [facility].  Pre-visit completed and red dot placed by patient's name on their procedure day (on provider's schedule).  .  Visit by phone   Pt states weight is 174   Instructed pt why it is important to and  to call if they have any changes in health or new medications. Directed them to the # given and on instructions.     Instructions reviewed. Pt given both [facility] main # and MD on call # prior to instructions.  Pt states understanding. Instructed to review again prior to procedure. Pt states they will.   Instructions sent by mail with coupon and by My Chart   Coupon sent via text to mobile phone and pt verified they received it

## 2023-06-04 ENCOUNTER — Ambulatory Visit: Payer: Medicaid Other | Admitting: Gastroenterology

## 2023-06-04 ENCOUNTER — Encounter: Payer: Medicaid Other | Admitting: Gastroenterology

## 2023-06-04 ENCOUNTER — Encounter: Payer: Self-pay | Admitting: Gastroenterology

## 2023-06-04 VITALS — BP 123/87 | HR 75 | Temp 97.6°F | Resp 18 | Ht 68.5 in | Wt 274.0 lb

## 2023-06-04 DIAGNOSIS — E119 Type 2 diabetes mellitus without complications: Secondary | ICD-10-CM | POA: Diagnosis not present

## 2023-06-04 DIAGNOSIS — D123 Benign neoplasm of transverse colon: Secondary | ICD-10-CM | POA: Diagnosis not present

## 2023-06-04 DIAGNOSIS — Z1211 Encounter for screening for malignant neoplasm of colon: Secondary | ICD-10-CM | POA: Diagnosis not present

## 2023-06-04 DIAGNOSIS — K644 Residual hemorrhoidal skin tags: Secondary | ICD-10-CM

## 2023-06-04 DIAGNOSIS — D122 Benign neoplasm of ascending colon: Secondary | ICD-10-CM | POA: Diagnosis not present

## 2023-06-04 DIAGNOSIS — D121 Benign neoplasm of appendix: Secondary | ICD-10-CM | POA: Diagnosis not present

## 2023-06-04 DIAGNOSIS — K5730 Diverticulosis of large intestine without perforation or abscess without bleeding: Secondary | ICD-10-CM

## 2023-06-04 DIAGNOSIS — Z860100 Personal history of colon polyps, unspecified: Secondary | ICD-10-CM

## 2023-06-04 DIAGNOSIS — D120 Benign neoplasm of cecum: Secondary | ICD-10-CM

## 2023-06-04 DIAGNOSIS — I10 Essential (primary) hypertension: Secondary | ICD-10-CM | POA: Diagnosis not present

## 2023-06-04 DIAGNOSIS — Z860101 Personal history of adenomatous and serrated colon polyps: Secondary | ICD-10-CM | POA: Diagnosis not present

## 2023-06-04 DIAGNOSIS — K648 Other hemorrhoids: Secondary | ICD-10-CM | POA: Diagnosis not present

## 2023-06-04 DIAGNOSIS — E785 Hyperlipidemia, unspecified: Secondary | ICD-10-CM | POA: Diagnosis not present

## 2023-06-04 MED ORDER — SODIUM CHLORIDE 0.9 % IV SOLN: 500.0000 mL | Freq: Once | INTRAVENOUS | Status: DC

## 2023-06-04 NOTE — Progress Notes (Signed)
Called to room to assist during endoscopic procedure.  Patient ID and intended procedure confirmed with present staff. Received instructions for my participation in the procedure from the performing physician.  

## 2023-06-04 NOTE — Op Note (Signed)
[facility] Patient Name: [name] Procedure Date: 06/04/2023 10:05 AM MRN: [id number] Endoscopist: Napoleon Form , MD, [id number] Age: 62 Referring MD:  Date of Birth: Oct 26, 1960 Gender: Female Account #: [id number] Procedure:                Colonoscopy Indications:              High risk colon cancer surveillance: Personal                            history of colonic polyps Medicines:                Monitored Anesthesia Care Procedure:                Pre-Anesthesia Assessment:                           - Prior to the procedure, a History and Physical                            was performed, and patient medications and                            allergies were reviewed. The patient's tolerance of                            previous anesthesia was also reviewed. The risks                            and benefits of the procedure and the sedation                            options and risks were discussed with the patient.                            All questions were answered, and informed consent                            was obtained. Prior Anticoagulants: The patient has                            taken no anticoagulant or antiplatelet agents. ASA                            Grade Assessment: III - A patient with severe                            systemic disease. After reviewing the risks and                            benefits, the patient was deemed in satisfactory                            condition to undergo the procedure.  After obtaining informed consent, the colonoscope                            was passed under direct vision. Throughout the                            procedure, the patient's blood pressure, pulse, and                            oxygen saturations were monitored continuously. The                            Olympus Scope [id number] was introduced through the                            anus and advanced to the  the cecum, identified by                            appendiceal orifice and ileocecal valve. The                            colonoscopy was performed without difficulty. The                            patient tolerated the procedure well. The quality                            of the bowel preparation was good. The ileocecal                            valve, appendiceal orifice, and rectum were                            photographed. Scope In: 10:09:00 AM Scope Out: 10:33:58 AM Scope Withdrawal Time: 0 hours 19 minutes 29 seconds  Total Procedure Duration: 0 hours 24 minutes 58 seconds  Findings:                 The perianal and digital rectal examinations were                            normal.                           A 14 mm polyp was found in the transverse colon.                            The polyp was semi-pedunculated. The polyp was                            removed with a hot snare. Resection and retrieval                            were complete.  Six sessile polyps were found in the transverse                            colon, ascending colon and appendiceal orifice. The                            polyps were 4 to 7 mm in size. These polyps were                            removed with a cold snare. Resection and retrieval                            were complete.                           Multiple large-mouthed, medium-mouthed and                            small-mouthed diverticula were found in the sigmoid                            colon, descending colon, transverse colon and                            ascending colon. There was narrowing of the colon                            in association with the diverticular opening. There                            was evidence of diverticular spasm.                            Peri-diverticular erythema was seen.                           Non-bleeding external and internal hemorrhoids were                             found during retroflexion. The hemorrhoids were                            medium-sized. Complications:            No immediate complications. Estimated Blood Loss:     Estimated blood loss was minimal. Impression:               - One 14 mm polyp in the transverse colon, removed                            with a hot snare. Resected and retrieved.                           - Six 4 to 7 mm polyps in the transverse colon, in  the ascending colon and at the appendiceal orifice,                            removed with a cold snare. Resected and retrieved.                           - Severe diverticulosis in the sigmoid colon, in                            the descending colon, in the transverse colon and                            in the ascending colon. There was narrowing of the                            colon in association with the diverticular opening.                            There was evidence of diverticular spasm.                            Peri-diverticular erythema was seen.                           - Non-bleeding external and internal hemorrhoids. Recommendation:           - Patient has a contact number available for                            emergencies. The signs and symptoms of potential                            delayed complications were discussed with the                            patient. Return to normal activities tomorrow.                            Written discharge instructions were provided to the                            patient.                           - Resume previous diet.                           - Continue present medications.                           - Await pathology results.                           - Repeat colonoscopy in 3 years for surveillance  based on pathology results. Napoleon Form, MD 06/04/2023 10:39:40 AM This report has been signed electronically.

## 2023-06-04 NOTE — Progress Notes (Signed)
Sedate, gd SR, tolerated procedure well, VSS, report to RN 

## 2023-06-04 NOTE — Progress Notes (Signed)
Pt's states no medical or surgical changes since previsit or office visit. 

## 2023-06-04 NOTE — Patient Instructions (Signed)
Discharge instructions given. Handouts on polyps,Diverticulosis and Hemorrhoids. Resume previous medications. YOU HAD AN ENDOSCOPIC PROCEDURE TODAY AT [facility]:   Refer to the procedure report that was given to you for any specific questions about what was found during the examination.  If the procedure report does not answer your questions, please call your gastroenterologist to clarify.  If you requested that your care partner not be given the details of your procedure findings, then the procedure report has been included in a sealed envelope for you to review at your convenience later.  YOU SHOULD EXPECT: Some feelings of bloating in the abdomen. Passage of more gas than usual.  Walking can help get rid of the air that was put into your GI tract during the procedure and reduce the bloating. If you had a lower endoscopy (such as a colonoscopy or flexible sigmoidoscopy) you may notice spotting of blood in your stool or on the toilet paper. If you underwent a bowel prep for your procedure, you may not have a normal bowel movement for a few days.  Please Note:  You might notice some irritation and congestion in your nose or some drainage.  This is from the oxygen used during your procedure.  There is no need for concern and it should clear up in a day or so.  SYMPTOMS TO REPORT IMMEDIATELY:  Following lower endoscopy (colonoscopy or flexible sigmoidoscopy):  Excessive amounts of blood in the stool  Significant tenderness or worsening of abdominal pains  Swelling of the abdomen that is new, acute  Fever of 100F or higher   For urgent or emergent issues, a gastroenterologist can be reached at any hour by calling [phone]. Do not use MyChart messaging for urgent concerns.    DIET:  We do recommend a small meal at first, but then you may proceed to your regular diet.  Drink plenty of fluids but you should avoid alcoholic beverages for 24 hours.  ACTIVITY:  You should  plan to take it easy for the rest of today and you should NOT DRIVE or use heavy machinery until tomorrow (because of the sedation medicines used during the test).    FOLLOW UP: Our staff will call the number listed on your records the next business day following your procedure.  We will call around 7:15- 8:00 am to check on you and address any questions or concerns that you may have regarding the information given to you following your procedure. If we do not reach you, we will leave a message.     If any biopsies were taken you will be contacted by phone or by letter within the next 1-3 weeks.  Please call us at [phone] if you have not heard about the biopsies in 3 weeks.    SIGNATURES/CONFIDENTIALITY: You and/or your care partner have signed paperwork which will be entered into your electronic medical record.  These signatures attest to the fact that that the information above on your After Visit Summary has been reviewed and is understood.  Full responsibility of the confidentiality of this discharge information lies with you and/or your care-partner.

## 2023-06-04 NOTE — Progress Notes (Signed)
[facility] History and Physical   Primary Care Physician:  Erick Alley, DO   Reason for Procedure:  History of adenomatous colon polyps  Plan:    Surveillance colonoscopy with possible interventions as needed     HPI: [name] is a very pleasant 62 y.o. female here for surveillance colonoscopy. Denies any nausea, vomiting, abdominal pain, melena or bright red blood per rectum  The risks and benefits as well as alternatives of endoscopic procedure(s) have been discussed and reviewed. All questions answered. The patient agrees to proceed.    Past Medical History:  Diagnosis Date   Arthritis    Cervical cancer (HCC) 2007   s/p partial hysterectomy and radiation   Diabetes mellitus without complication (HCC)    Hyperlipidemia    Hypertension    Sickle cell trait (HCC)     Past Surgical History:  Procedure Laterality Date   ABDOMINAL HYSTERECTOMY  2007   for cervical cancer; radical   BACK SURGERY     COLONOSCOPY     JOINT REPLACEMENT     left hip replacement 3-5 years ago    Prior to Admission medications   Medication Sig Start Date End Date Taking? Authorizing Provider  amLODipine (NORVASC) 10 MG tablet Take 1 tablet (10 mg total) by mouth daily. Please make an appointment before refill. 05/22/22  Yes Erick Alley, DO  lisinopril (ZESTRIL) 20 MG tablet Take 1 tablet (20 mg total) by mouth daily. 05/22/22  Yes Erick Alley, DO  metFORMIN (GLUCOPHAGE) 1000 MG tablet TAKE 1 TABLET BY MOUTH TWICE DAILY WITH  A  MEAL. FOLLOW UP WITH PCP. 01/17/22  Yes Erick Alley, DO  chlorhexidine (PERIDEX) 0.12 % solution RINSE MOUTH WITH 15 MLS (1 CAPFUL) FOR 30 SECONDS AM AND PM AFTER TOOTHBRUSHING. EXPECTORATE AFTER RINSING, DO NOT SWALLOW 12/11/22   [provider]  pravastatin (PRAVACHOL) 40 MG tablet Take 1 tablet by mouth once daily 06/07/22   Erick Alley, DO  Semaglutide, 2 MG/DOSE, (OZEMPIC, 2 MG/DOSE,) 8 MG/3ML SOPN Inject 2 mg into the skin once a  week. 04/11/23   Erick Alley, DO    Current Outpatient Medications  Medication Sig Dispense Refill   amLODipine (NORVASC) 10 MG tablet Take 1 tablet (10 mg total) by mouth daily. Please make an appointment before refill. 90 tablet 3   lisinopril (ZESTRIL) 20 MG tablet Take 1 tablet (20 mg total) by mouth daily. 90 tablet 3   metFORMIN (GLUCOPHAGE) 1000 MG tablet TAKE 1 TABLET BY MOUTH TWICE DAILY WITH  A  MEAL. FOLLOW UP WITH PCP. 180 tablet 1   chlorhexidine (PERIDEX) 0.12 % solution RINSE MOUTH WITH 15 MLS (1 CAPFUL) FOR 30 SECONDS AM AND PM AFTER TOOTHBRUSHING. EXPECTORATE AFTER RINSING, DO NOT SWALLOW     pravastatin (PRAVACHOL) 40 MG tablet Take 1 tablet by mouth once daily 90 tablet 3   Semaglutide, 2 MG/DOSE, (OZEMPIC, 2 MG/DOSE,) 8 MG/3ML SOPN Inject 2 mg into the skin once a week. 3 mL 2   Current Facility-Administered Medications  Medication Dose Route Frequency Provider Last Rate Last Admin   0.9 %  sodium chloride infusion  500 mL Intravenous Once Napoleon Form, MD        Allergies as of 06/04/2023 - Review Complete 06/04/2023  Allergen Reaction Noted   Penicillins Swelling 03/27/2012    Family History  Problem Relation Age of Onset   Cancer Mother        breast cancer, died at 55yo   Breast cancer  Mother 71   Seizures Father    Diabetes Maternal Aunt    Diabetes Maternal Uncle    Diabetes Brother    Colon cancer Neg Hx    Colon polyps Neg Hx    Esophageal cancer Neg Hx    Rectal cancer Neg Hx    Stomach cancer Neg Hx     Social History   Socioeconomic History   Marital status: Single    Spouse name: Not on file   Number of children: Not on file   Years of education: Not on file   Highest education level: Not on file  Occupational History   Not on file  Tobacco Use   Smoking status: Former    Current packs/day: 0.00    Types: Cigarettes    Quit date: 06/05/1996    Years since quitting: 27.0   Smokeless tobacco: Never   Tobacco comments:    quit  10-15 years ago  Vaping Use   Vaping status: Never Used  Substance and Sexual Activity   Alcohol use: Yes    Comment: 3-4 times a week   Drug use: Never   Sexual activity: Not Currently    Birth control/protection: Surgical    Comment: Hyst d/t cervical cancer  Other Topics Concern   Not on file  Social History Narrative   Right handed   Lives alone one story home   Social Drivers of Health   Financial Resource Strain: Not on file  Food Insecurity: Not on file  Transportation Needs: Not on file  Physical Activity: Not on file  Stress: Not on file  Social Connections: Not on file  Intimate Partner Violence: Not on file    Review of Systems:  All other review of systems negative except as mentioned in the HPI.  Physical Exam: Vital signs in last 24 hours: BP (!) 142/88   Pulse 85   Temp 97.6 F (36.4 C)   Ht 5' 8.5" (1.74 m)   Wt 274 lb (124.3 kg)   SpO2 98%   BMI 41.06 kg/m  General:   Alert, NAD Lungs:  Clear .   Heart:  Regular rate and rhythm Abdomen:  Soft, nontender and nondistended. Neuro/Psych:  Alert and cooperative. Normal mood and affect. A and O x 3  Reviewed labs, radiology imaging, old records and pertinent past GI work up  Patient is appropriate for planned procedure(s) and anesthesia in an ambulatory setting   K. Scherry Ran , MD [phone]

## 2023-06-05 ENCOUNTER — Telehealth: Payer: Self-pay

## 2023-06-05 NOTE — Telephone Encounter (Signed)
  Follow up Call-     06/04/2023    8:57 AM  Call back number  Post procedure Call Back phone  # [phone]  Permission to leave phone message Yes     Patient questions:  Do you have a fever, pain , or abdominal swelling? No. Pain Score  0 *  Have you tolerated food without any problems? Yes.    Have you been able to return to your normal activities? Yes.    Do you have any questions about your discharge instructions: Diet   No. Medications  No. Follow up visit  No.  Do you have questions or concerns about your Care? No.  Actions: * If pain score is 4 or above: No action needed, pain <4.

## 2023-06-07 ENCOUNTER — Other Ambulatory Visit: Payer: Self-pay | Admitting: Student

## 2023-06-07 DIAGNOSIS — I10 Essential (primary) hypertension: Secondary | ICD-10-CM

## 2023-06-07 DIAGNOSIS — E119 Type 2 diabetes mellitus without complications: Secondary | ICD-10-CM

## 2023-06-07 DIAGNOSIS — E785 Hyperlipidemia, unspecified: Secondary | ICD-10-CM

## 2023-06-08 LAB — SURGICAL PATHOLOGY

## 2023-06-25 NOTE — Progress Notes (Deleted)
    SUBJECTIVE:   CHIEF COMPLAINT / HPI:   ***DM foot exam and eye exam  Obesity Currently on Ozempic 2 mg daily.  Goals made at last visit: Exercise goal: Exercise 3 x/week using 15 minute exercise videos for beginners on youtube.    Dietary goal: Bake meats instead of fry 4 times out of the week. Also work on reducing carbs. See diabetic diet handout   PERTINENT  PMH / PSH: ***  OBJECTIVE:   There were no vitals taken for this visit. ***  General: NAD, pleasant, able to participate in exam Cardiac: RRR, no murmurs. Respiratory: CTAB, normal effort, No wheezes, rales or rhonchi Abdomen: Bowel sounds present, nontender, nondistended, no hepatosplenomegaly. Extremities: no edema or cyanosis. Skin: warm and dry, no rashes noted Neuro: alert, no obvious focal deficits Psych: Normal affect and mood  ASSESSMENT/PLAN:   No problem-specific Assessment & Plan notes found for this encounter.     Dr. Erick Alley, DO [facility]    {    This will disappear when note is signed, click to select method of visit    :1}

## 2023-06-26 ENCOUNTER — Ambulatory Visit: Payer: Medicaid Other | Admitting: Student

## 2023-07-03 ENCOUNTER — Encounter: Payer: Self-pay | Admitting: Gastroenterology

## 2023-07-11 ENCOUNTER — Ambulatory Visit: Payer: Medicaid Other | Admitting: Physician Assistant

## 2023-07-18 ENCOUNTER — Ambulatory Visit: Payer: Medicaid Other | Admitting: Orthopaedic Surgery

## 2023-08-14 NOTE — Progress Notes (Signed)
 Erroneous encounter

## 2023-08-15 ENCOUNTER — Other Ambulatory Visit: Payer: Self-pay

## 2023-08-15 DIAGNOSIS — E785 Hyperlipidemia, unspecified: Secondary | ICD-10-CM

## 2023-08-15 DIAGNOSIS — I10 Essential (primary) hypertension: Secondary | ICD-10-CM

## 2023-08-15 DIAGNOSIS — E119 Type 2 diabetes mellitus without complications: Secondary | ICD-10-CM

## 2023-08-15 MED ORDER — PRAVASTATIN SODIUM 40 MG PO TABS: 40.0000 mg | ORAL_TABLET | Freq: Every day | ORAL | 3 refills | Status: AC

## 2023-08-15 MED ORDER — AMLODIPINE BESYLATE 10 MG PO TABS: 10.0000 mg | ORAL_TABLET | Freq: Every day | ORAL | 3 refills | Status: AC

## 2023-08-15 MED ORDER — LISINOPRIL 20 MG PO TABS: 20.0000 mg | ORAL_TABLET | Freq: Every day | ORAL | 3 refills | Status: AC

## 2023-08-15 MED ORDER — METFORMIN HCL 1000 MG PO TABS: 1000.0000 mg | ORAL_TABLET | Freq: Two times a day (BID) | ORAL | 3 refills | Status: DC

## 2023-08-15 MED ORDER — OZEMPIC (2 MG/DOSE) 8 MG/3ML ~~LOC~~ SOPN: 2.0000 mg | PEN_INJECTOR | SUBCUTANEOUS | 2 refills | Status: DC

## 2023-08-22 ENCOUNTER — Encounter: Payer: Medicaid Other | Admitting: Obstetrics and Gynecology

## 2023-09-25 ENCOUNTER — Other Ambulatory Visit (INDEPENDENT_AMBULATORY_CARE_PROVIDER_SITE_OTHER)

## 2023-09-25 ENCOUNTER — Ambulatory Visit: Admitting: Orthopaedic Surgery

## 2023-09-25 VITALS — Ht 68.0 in | Wt 270.6 lb

## 2023-09-25 DIAGNOSIS — M25551 Pain in right hip: Secondary | ICD-10-CM

## 2023-09-25 DIAGNOSIS — Z6841 Body Mass Index (BMI) 40.0 and over, adult: Secondary | ICD-10-CM | POA: Insufficient documentation

## 2023-09-25 MED ORDER — BUPIVACAINE HCL 0.5 % IJ SOLN
3.0000 mL | INTRAMUSCULAR | Status: AC | PRN
  Administered 2023-09-25: 3 mL via INTRA_ARTICULAR

## 2023-09-25 MED ORDER — METHYLPREDNISOLONE ACETATE 40 MG/ML IJ SUSP
40.0000 mg | INTRAMUSCULAR | Status: AC | PRN
  Administered 2023-09-25: 40 mg via INTRA_ARTICULAR

## 2023-09-25 MED ORDER — NAPROXEN 500 MG PO TABS: 500.0000 mg | ORAL_TABLET | Freq: Two times a day (BID) | ORAL | 3 refills | Status: DC

## 2023-09-25 MED ORDER — LIDOCAINE HCL 1 % IJ SOLN
3.0000 mL | INTRAMUSCULAR | Status: AC | PRN
Start: 2023-09-25 — End: 2023-09-25
  Administered 2023-09-25: 3 mL

## 2023-09-25 NOTE — Progress Notes (Addendum)
 Office Visit Note   Patient: [name]           Date of Birth: [birth year]           MRN: [id number] Visit Date: 09/25/2023              Requested by: Glenn Lange, DO [address] [place],  [place] [address] PCP: Glenn Lange, DO   Assessment & Plan: Visit Diagnoses:  1. Pain in right hip   2. Morbid obesity (HCC)   3. Body mass index 40.0-44.9, adult [facility])     Plan: Patient is a 63 year old female with right trochanteric hip pain.  Treatment options were given.  She declined physical therapy.  Would like to try cortisone injection which she tolerated well.  She will follow-up with us  as needed.  Naproxen  prescribed for breakthrough pain.  The patient meets the AMA guidelines for Morbid (severe) obesity with a BMI > 40.0 and I have recommended weight loss.  Follow-Up Instructions: No follow-ups on file.   Orders:  Orders Placed This Encounter  Procedures  . Large Joint Inj: R greater trochanter  . XR HIP UNILAT W OR W/O PELVIS 2-3 VIEWS RIGHT   Meds ordered this encounter  Medications  . naproxen  (NAPROSYN ) 500 MG tablet    Sig: Take 1 tablet (500 mg total) by mouth 2 (two) times daily with a meal.    Dispense:  30 tablet    Refill:  3  . bupivacaine  (MARCAINE ) 0.5 % (with pres) injection 3 mL  . lidocaine  (XYLOCAINE ) 1 % (with pres) injection 3 mL  . methylPREDNISolone  acetate (DEPO-MEDROL ) injection 40 mg      Procedures: Large Joint Inj: R greater trochanter on 09/25/2023 12:40 PM Indications: pain Details: 22 G needle  Arthrogram: No  Medications: 3 mL lidocaine  1 %; 3 mL bupivacaine  0.5 %; 40 mg methylPREDNISolone  acetate 40 MG/ML Patient was prepped and draped in the usual sterile fashion.      Clinical Data: No additional findings.   Subjective: Chief Complaint  Patient presents with  . Right Hip - Pain    HPI [name] is a 63 year old female here for right hip pain for 2 months.  Complaining of lateral hip pain.  Wakes up with  pain and reports start up pain and stiffness.  Denies any back pain or radicular symptoms. Review of Systems   Objective: Vital Signs: Ht 5\' 8"  (1.727 m)   Wt 270 lb 9.6 oz (122.7 kg)   BMI 41.14 kg/m   Physical Exam  Ortho Exam Exam of the right hip shows tenderness to the trochanteric area.  Slight pain with hip range of motion.  Slight antalgic gait.  No sciatic tension signs. Specialty Comments:  No specialty comments available.  Imaging: XR HIP UNILAT W OR W/O PELVIS 2-3 VIEWS RIGHT Result Date: 09/25/2023 X-rays of the right hip show no acute or structural abnormalities.  Minimal osteoarthritis.    PMFS History: Patient Active Problem List   Diagnosis Date Noted  . Body mass index 40.0-44.9, adult (HCC) 09/25/2023  . Primary osteoarthritis of right knee 03/07/2022  . Primary osteoarthritis of left knee 03/07/2022  . History of cervical cancer 01/18/2022  . Hyperlipidemia   . Pain in right hip 12/01/2020  . Foot pain, bilateral 12/01/2020  . Diabetic neuropathy (HCC) 04/17/2019  . Low back pain 03/21/2019  . DM (diabetes mellitus), type 2 (HCC) 08/14/2018  . Morbid obesity (HCC) 04/09/2012  . Metabolic syndrome 04/09/2012  .  Primary hypertension 03/31/2012  . Malignant neoplasm of cervix uteri (HCC) 08/02/2006   Past Medical History:  Diagnosis Date  . Arthritis   . Cervical cancer (HCC) 2007   s/p partial hysterectomy and radiation  . Diabetes mellitus without complication (HCC)   . Hyperlipidemia   . Hypertension   . Sickle cell trait (HCC)     Family History  Problem Relation Age of Onset  . Cancer Mother        breast cancer, died at 13yo  . Breast cancer Mother 65  . Seizures Father   . Diabetes Maternal Aunt   . Diabetes Maternal Uncle   . Diabetes Brother   . Colon cancer Neg Hx   . Colon polyps Neg Hx   . Esophageal cancer Neg Hx   . Rectal cancer Neg Hx   . Stomach cancer Neg Hx     Past Surgical History:  Procedure Laterality Date  .  ABDOMINAL HYSTERECTOMY  2007   for cervical cancer; radical  . BACK SURGERY    . COLONOSCOPY    . JOINT REPLACEMENT     left hip replacement 3-5 years ago   Social History   Occupational History  . Not on file  Tobacco Use  . Smoking status: Former    Current packs/day: 0.00    Types: Cigarettes    Quit date: 06/05/1996    Years since quitting: 27.3  . Smokeless tobacco: Never  . Tobacco comments:    quit 10-15 years ago  Vaping Use  . Vaping status: Never Used  Substance and Sexual Activity  . Alcohol use: Yes    Comment: 3-4 times a week  . Drug use: Never  . Sexual activity: Not Currently    Birth control/protection: Surgical    Comment: Hyst d/t cervical cancer

## 2023-10-03 ENCOUNTER — Encounter: Payer: Self-pay | Admitting: Obstetrics and Gynecology

## 2023-10-03 ENCOUNTER — Ambulatory Visit
Admission: RE | Admit: 2023-10-03 | Discharge: 2023-10-03 | Disposition: A | Discharge status: Home / Self Care | Payer: BLUE CROSS/BLUE SHIELD | Source: Ambulatory Visit | Attending: Obstetrics and Gynecology | Admitting: Obstetrics and Gynecology

## 2023-10-03 DIAGNOSIS — N958 Other specified menopausal and perimenopausal disorders: Secondary | ICD-10-CM | POA: Diagnosis not present

## 2023-10-03 DIAGNOSIS — Z780 Asymptomatic menopausal state: Secondary | ICD-10-CM

## 2023-10-19 ENCOUNTER — Ambulatory Visit: Admitting: Student

## 2023-10-19 ENCOUNTER — Encounter: Payer: Self-pay | Admitting: Student

## 2023-10-19 ENCOUNTER — Telehealth: Payer: Self-pay

## 2023-10-19 VITALS — BP 116/80 | HR 98 | Wt 265.0 lb

## 2023-10-19 DIAGNOSIS — M1711 Unilateral primary osteoarthritis, right knee: Secondary | ICD-10-CM

## 2023-10-19 DIAGNOSIS — N3941 Urge incontinence: Secondary | ICD-10-CM | POA: Diagnosis not present

## 2023-10-19 MED ORDER — MIRABEGRON ER 25 MG PO TB24: 25.0000 mg | ORAL_TABLET | Freq: Every day | ORAL | 0 refills | Status: DC

## 2023-10-19 NOTE — Assessment & Plan Note (Signed)
 Urge incontinence for nearly a year, likely related to previous childbirth and partial hysterectomy. Oxybutynin avoided due to anticholinergic effects. Myrbetriq considered suitable with potential side effects of hypertension which will be monitored.  Patient declines urology and pelvic floor physical therapy referral today - Prescribe Myrbetriq 25 mg once daily. - Instruct on Kegel exercises: 3-4 times daily, 10 squeezes per set, holding each for 5 seconds. - Recommend pelvic floor physical therapy if initial treatment is ineffective. - Schedule follow-up in one month to assess treatment efficacy and tolerance.

## 2023-10-19 NOTE — Patient Instructions (Signed)
 It was great to see you! Thank you for allowing me to participate in your care!  I recommend that you always bring your medications to each appointment as this makes it easy to ensure you are on the correct medications and helps us  not miss when refills are needed.  Our plans for today:  - Myrbetriq sent to pharmacy for her urinary urge incontinence.  Take once daily, return in 4 weeks for follow-up - Also recommend doing Kegel exercises 3-4 times a day with each set consisting of 10 squeezes holding each squeeze for 5 seconds     Take care and seek immediate care sooner if you develop any concerns.   Dr. Glenn Lange, DO [facility]

## 2023-10-19 NOTE — Assessment & Plan Note (Signed)
 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 Right knee more severely affected, causing daily pain and impacting mobility. Cortisone injections ineffective. Under orthopedic care with recommendation for weight loss prior to surgery. - Issue handicap placard for mobility assistance. - Return to Discuss weight loss as prerequisite for knee replacement surgery if desired

## 2023-10-19 NOTE — Progress Notes (Signed)
    SUBJECTIVE:   CHIEF COMPLAINT / HPI:   [name] is a 63 year old female with severe osteoarthritis who presents for a handicap placard and urinary incontinence.  She has severe osteoarthritis in both knees, described as 'bone on bone', causing significant pain and difficulty walking. She experiences daily pain, with some days being particularly severe, requiring the use of a cane for mobility. Cortisone shots no longer provide relief. An orthopedic doctor has recommended knee replacement surgery for both knees.  She is requesting handicap Placard today.  She experiences urge urinary incontinence for almost a year, with leakage when unable to reach the bathroom in time, necessitating the use of pads daily. She urinates frequently with small volumes. There are no symptoms of stress incontinence, such as leakage with coughing or sneezing. No burning sensation during urination, constipation, or fecal incontinence.   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 Right knee more severely affected, causing daily pain and impacting mobility. Cortisone injections ineffective. Under orthopedic care with recommendation for weight loss prior to surgery. - Issue handicap placard for mobility assistance. - Return to Discuss weight loss as prerequisite for knee replacement surgery if desired  Urge incontinence Urge incontinence for nearly a year, likely related to previous childbirth and partial hysterectomy. Oxybutynin avoided due to anticholinergic effects. Myrbetriq considered suitable with potential  side effects of hypertension which will be monitored.  Patient declines urology and pelvic floor physical therapy referral today - Prescribe Myrbetriq 25 mg once daily. - Instruct on Kegel exercises: 3-4 times daily, 10 squeezes per set, holding each for 5 seconds. - Recommend pelvic floor physical therapy if initial treatment is ineffective. - Schedule follow-up in one month to assess treatment efficacy and tolerance.     Dr. Glenn Lange, DO [facility]

## 2023-10-19 NOTE — Telephone Encounter (Signed)
 Pharmacy Patient Advocate Encounter   Received notification from CoverMyMeds that prior authorization for Mirabegron ER 25MG  er tablets is required/requested.   Insurance verification completed.   The patient is insured through [facility] .   PA required; PA submitted to above mentioned insurance via CoverMyMeds Key/confirmation #/EOC [id number]. Status is pending

## 2023-10-19 NOTE — Telephone Encounter (Signed)
 Pharmacy Patient Advocate Encounter  Received notification from [facility] that Prior Authorization for  Mirabegron ER 25MG  er tablets  has been APPROVED from 10/19/23 to 10/18/24   PA #/Case ID/Reference #: [id number]

## 2023-10-31 ENCOUNTER — Other Ambulatory Visit: Payer: Self-pay | Admitting: Student

## 2023-10-31 DIAGNOSIS — E119 Type 2 diabetes mellitus without complications: Secondary | ICD-10-CM

## 2023-11-13 ENCOUNTER — Encounter: Payer: Self-pay | Admitting: *Deleted

## 2023-11-15 IMAGING — MG MM DIGITAL SCREENING BILAT W/ TOMO AND CAD
8 of 14 series · 8 of 40 positions shown · non-contrast
Comparison: Previous exam(s).

CLINICAL DATA: Screening.

EXAM:
DIGITAL SCREENING BILATERAL MAMMOGRAM WITH TOMOSYNTHESIS AND CAD
TECHNIQUE: Bilateral screening digital craniocaudal and mediolateral oblique
mammograms were obtained. Bilateral screening digital breast
tomosynthesis was performed. The images were evaluated with
computer-aided detection.

[R XCCL synth-2D]
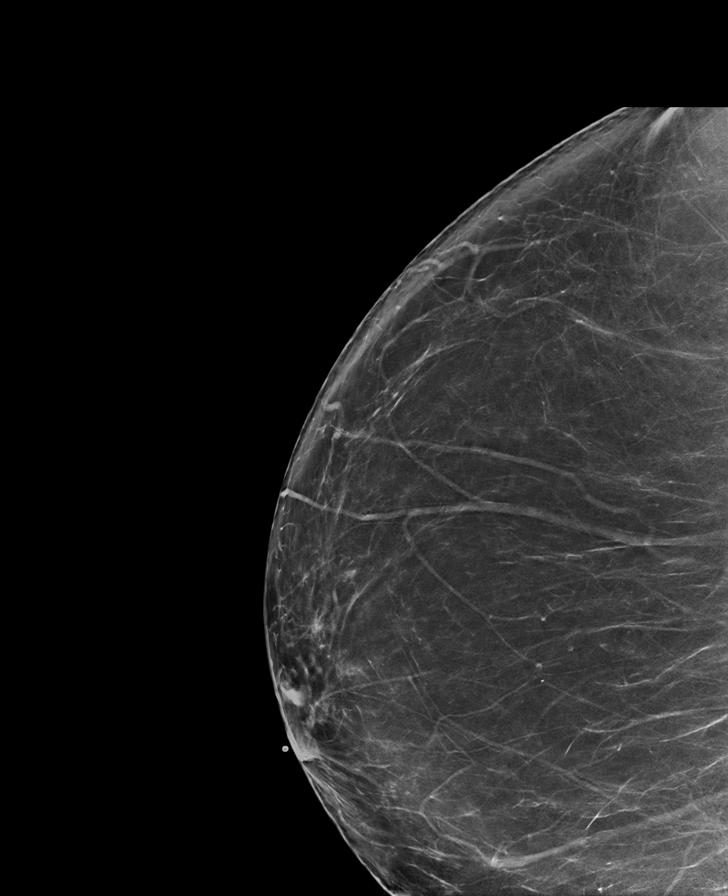

[R CC synth-2D]
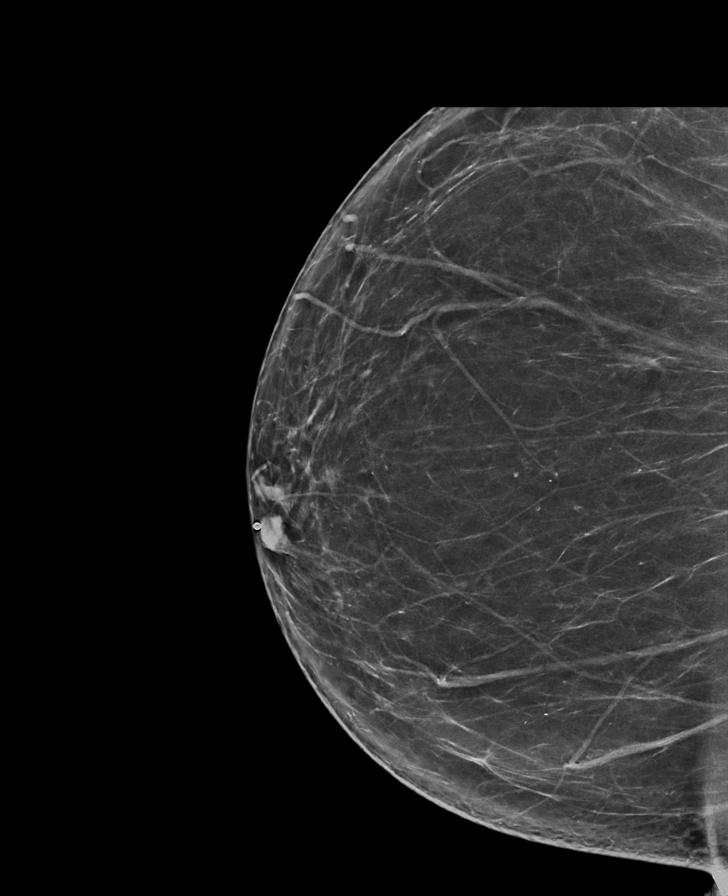

[L CC synth-2D]
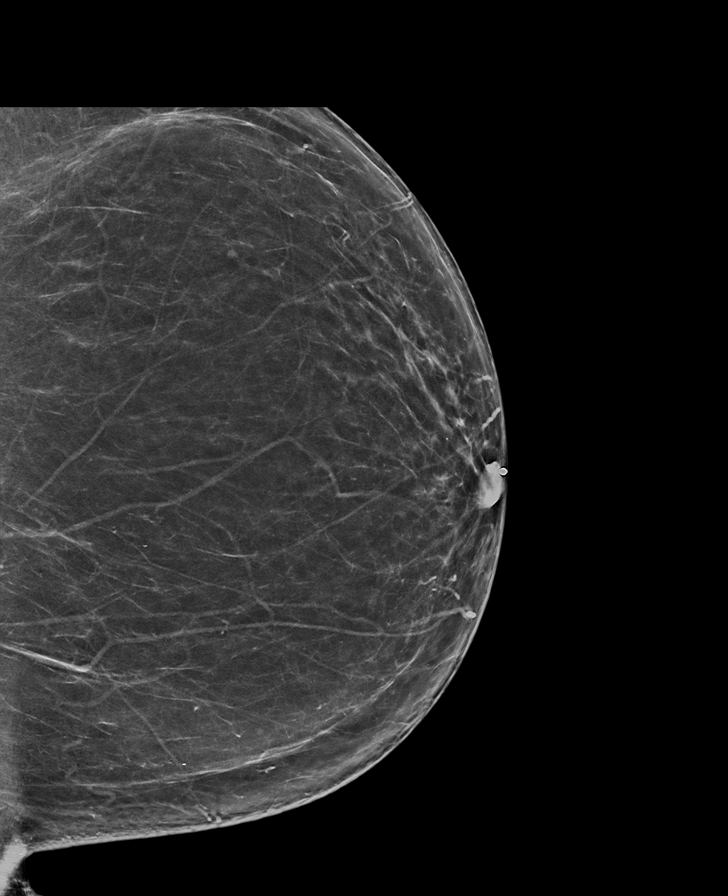

[L MLO synth-2D (1 of 2)]
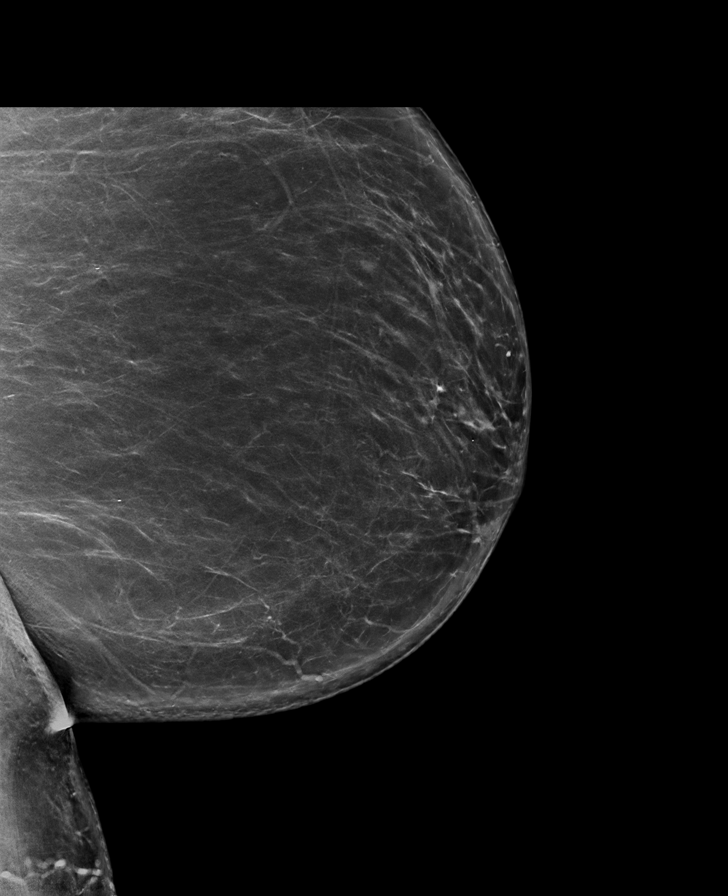

[L XCCL synth-2D]
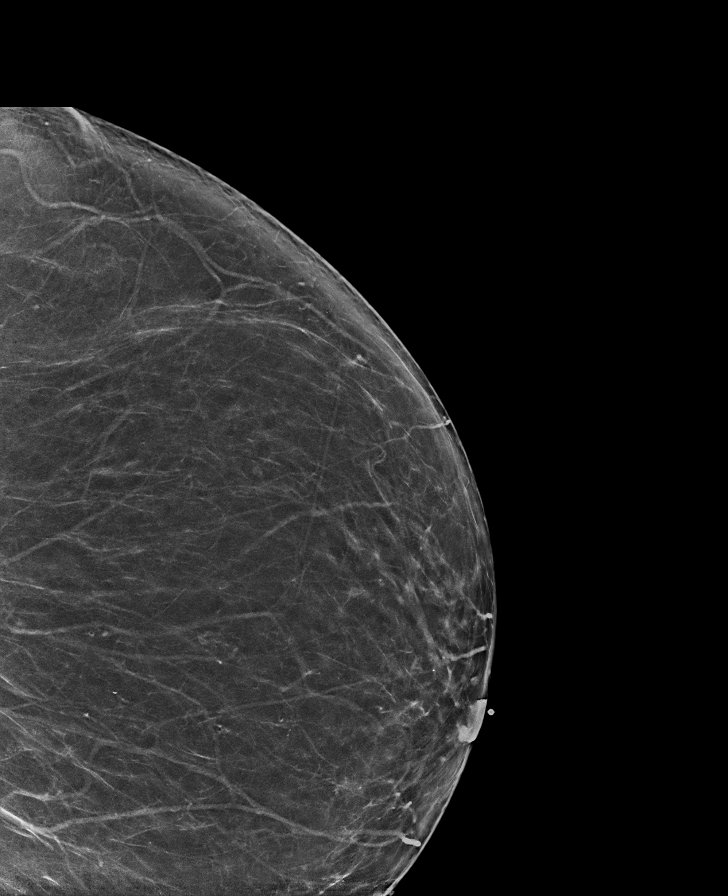

[R MLO synth-2D]
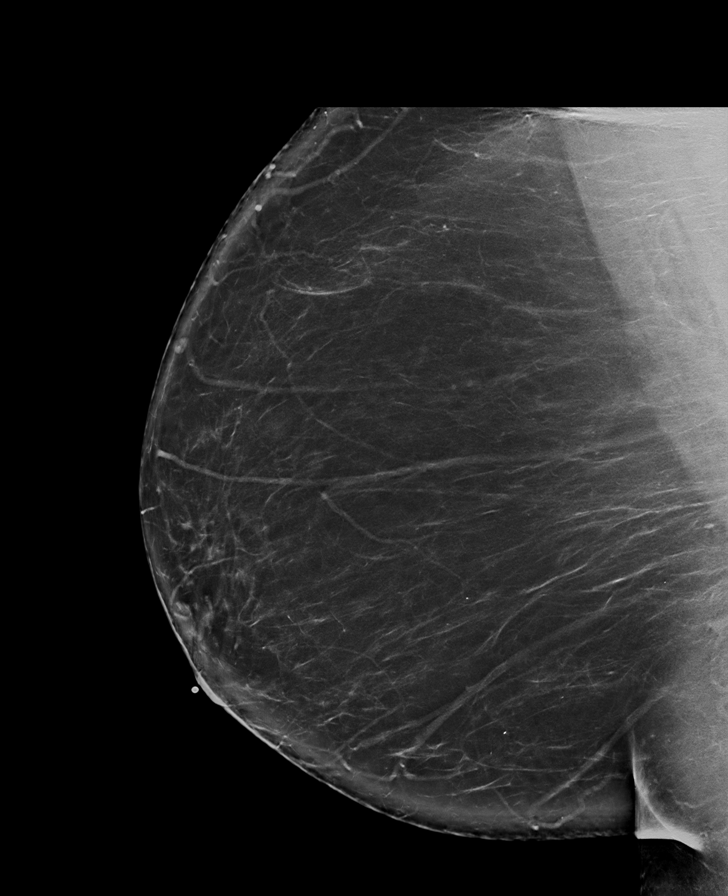

[L MLO synth-2D (2 of 2)]
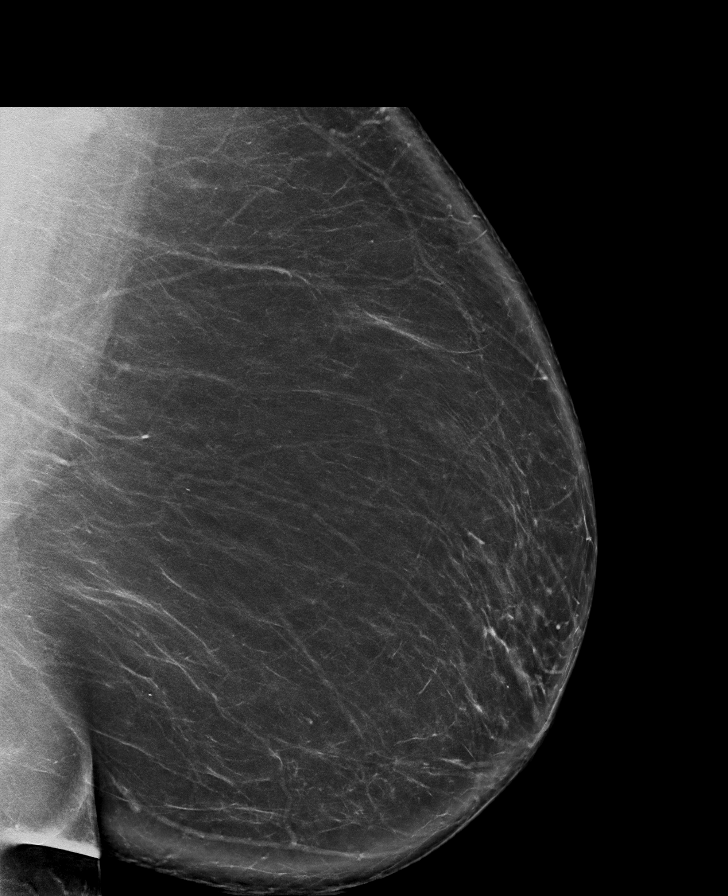

[L XCCL tomo · tomo slice 45/88.0]
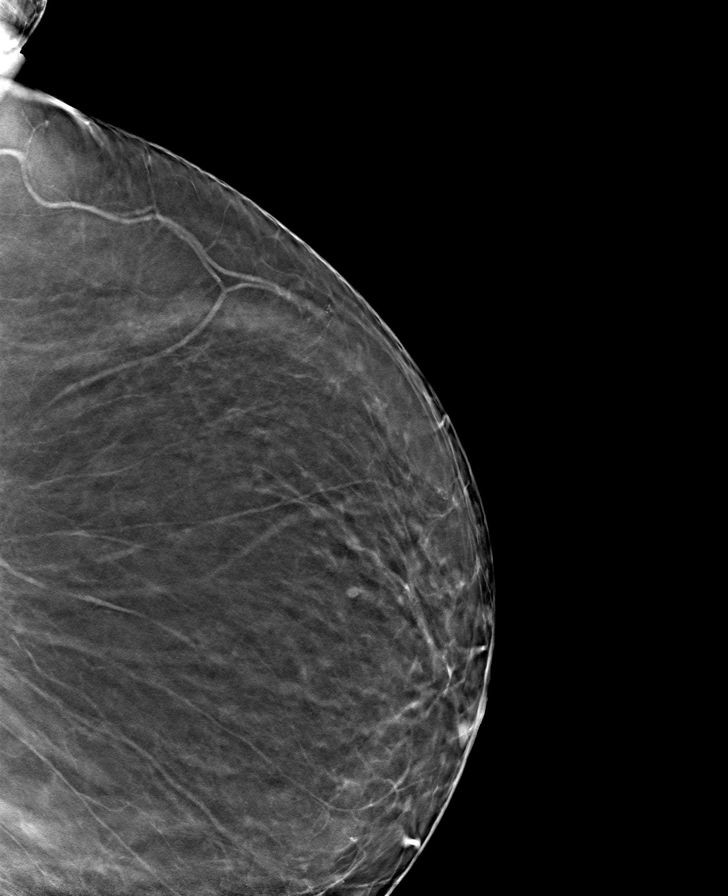

[8 of 40 positions shown; findings below may reference images not displayed]

ACR Breast Density Category b: There are scattered areas of
fibroglandular density.
FINDINGS: There are no findings suspicious for malignancy.
IMPRESSION: No mammographic evidence of malignancy. A result letter of this
screening mammogram will be mailed directly to the patient.

RECOMMENDATION:
Screening mammogram in one year. (Code:51-O-LD2)

BI-RADS CATEGORY  1: Negative.

## 2023-11-20 ENCOUNTER — Telehealth: Payer: Self-pay

## 2023-11-20 ENCOUNTER — Ambulatory Visit: Admitting: Student

## 2023-11-20 ENCOUNTER — Encounter: Payer: Self-pay | Admitting: Student

## 2023-11-20 DIAGNOSIS — R5383 Other fatigue: Secondary | ICD-10-CM | POA: Diagnosis not present

## 2023-11-20 DIAGNOSIS — E119 Type 2 diabetes mellitus without complications: Secondary | ICD-10-CM

## 2023-11-20 DIAGNOSIS — N3941 Urge incontinence: Secondary | ICD-10-CM | POA: Diagnosis not present

## 2023-11-20 LAB — POCT GLYCOSYLATED HEMOGLOBIN (HGB A1C): HbA1c, POC (controlled diabetic range): 5.7 % (ref 0.0–7.0)

## 2023-11-20 MED ORDER — TIRZEPATIDE-WEIGHT MANAGEMENT 10 MG/0.5ML ~~LOC~~ SOAJ
10.0000 mg | SUBCUTANEOUS | 0 refills | Status: DC
Start: 2023-11-20 — End: 2023-11-22

## 2023-11-20 MED ORDER — MIRABEGRON ER 50 MG PO TB24
50.0000 mg | ORAL_TABLET | Freq: Every day | ORAL | 1 refills | Status: DC
Start: 2023-11-20 — End: 2024-02-25

## 2023-11-20 NOTE — Assessment & Plan Note (Signed)
 No improvement with current medication. Discussed increasing Myrbetriq  dose. Prefers medication adjustments over physical therapy for financial reasons.  - Increase Myrbetriq  to 50 mg daily. - Return in a few weeks to assess response, if no improvement reconsider physical therapy or urology referral

## 2023-11-20 NOTE — Progress Notes (Signed)
 SUBJECTIVE:   CHIEF COMPLAINT / HPI:   [name] is a 63 year old female who presents for follow-up on urinary incontinence and weight management.   She manages her diabetes with Ozempic  and metformin , achieving an A1c of 5.7. She started Ozempic  about a year ago for blood sugar control with the added benefit of weight loss and has only lost about 10 pounds.  Her weight is a concern due to its impact on her osteoarthritis and potential knee replacement surgery.   She experiences urinary incontinence with urgency, see previous note for details.  She was started on Myrbetriq  25 mg dose with no signs of improvement.  She has declined pelvic floor physical therapy and urology referral due to cost concerns.  She would like to try increasing the dose of Myrbetriq .    She feels slightly fatigued for several months despite adequate sleep, no trouble breathing at night. She engages in minimal exercise but would like to increase cardio activity to three times a week for 15 minutes each session. Her diet includes a variety of meats, sometimes fried, baked, or grilled, with side dishes and does not drink sugary drinks often. She is open to dietary changes to support her health goals.   PERTINENT  PMH / PSH: Obesity, HTN, T2DM  OBJECTIVE:   BP 118/80   Pulse 77   Ht 5' 8 (1.727 m)   Wt 265 lb 9.6 oz (120.5 kg)   SpO2 96%   BMI 40.38 kg/m    General: NAD, pleasant, well-appearing Cardiac: RRR, no murmurs. Respiratory: CTAB, normal effort, No wheezes, rales or rhonchi Skin: warm and dry, no rashes noted Extremities: No edema BLEs, no deformity, wounds, erythema of bilateral feet.  Posterior tibial and dorsalis pedis pulses present, sensation intact with monofilament testing Neuro: alert, no obvious focal deficits Psych: Normal affect and mood  ASSESSMENT/PLAN:   Other fatigue Feeling generally fatigued over the last several months, no specific symptoms, just little tired.   Sleeping well per patient but could consider sleep study in future for further evaluation given obesity, would not be surprised patient had OSA. -Increase physical activity with hopes this boost energy level - CBC, TSH, BMP to look for any underlying causes  DM (diabetes mellitus), type 2 (HCC) Well-controlled on metformin  1000 mg twice daily and Ozempic  2 mg daily. - Continue metformin  - Stop Ozempic  as we is are switching to tirzepatide for obesity -Foot exam completed today and normal -Per patient has yearly eye exams -Recheck A1c in 3 months given switch of medications  Morbid obesity (HCC) Has not lost adequate weight on max dose of Ozempic  over the past year which she was taking for diabetes management. Would like to lose weight to aid in OA symptoms getting better health prior to knee replacement surgery. -Rx tirzepatide 10 mg injected weekly, can increase dose if tolerated well after 4 weeks - Follow-up visit in 3-4 weeks - Set goal of moderate intensity cardio for 15 minutes 3 times per week - Provided patient handout on diabetic diet with goal of following at 4 nights out of the week  Urge incontinence No improvement with current medication. Discussed increasing Myrbetriq  dose. Prefers medication adjustments over physical therapy for financial reasons.  - Increase Myrbetriq  to 50 mg daily. - Return in a few weeks to assess response, if no improvement reconsider physical therapy or urology referral      Dr. Glenn Lange, DO [facility]

## 2023-11-20 NOTE — Assessment & Plan Note (Signed)
 Well-controlled on metformin  1000 mg twice daily and Ozempic  2 mg daily. - Continue metformin  - Stop Ozempic  as we is are switching to tirzepatide for obesity -Foot exam completed today and normal -Per patient has yearly eye exams -Recheck A1c in 3 months given switch of medications

## 2023-11-20 NOTE — Assessment & Plan Note (Signed)
 Has not lost adequate weight on max dose of Ozempic  over the past year which she was taking for diabetes management. Would like to lose weight to aid in OA symptoms getting better health prior to knee replacement surgery. -Rx tirzepatide 10 mg injected weekly, can increase dose if tolerated well after 4 weeks - Follow-up visit in 3-4 weeks - Set goal of moderate intensity cardio for 15 minutes 3 times per week - Provided patient handout on diabetic diet with goal of following at 4 nights out of the week

## 2023-11-20 NOTE — Assessment & Plan Note (Addendum)
 Feeling generally fatigued over the last several months, no specific symptoms, just little tired.  Sleeping well per patient but could consider sleep study in future for further evaluation given obesity, would not be surprised patient had OSA. -Increase physical activity with hopes this boost energy level - CBC, TSH, BMP to look for any underlying causes

## 2023-11-20 NOTE — Patient Instructions (Addendum)
 It was great to see you! Thank you for allowing me to participate in your care!  I recommend that you always bring your medications to each appointment as this makes it easy to ensure you are on the correct medications and helps us  not miss when refills are needed.  Our plans for today:  - Increased dose of Myrbetriq  sent to pharmacy for urinary incontinence - We are switching from Ozempic  to  tirzepatide (Zepbound) to see if this will aid in weight loss.  Stop Ozempic .  Start tirzepatide 10 mg injected weekly.  If you tolerate this well after 4 weeks, the dose can be increased - Return in about 3 weeks to see how you are tolerating these new medications and discuss increased dose of tirzepatid  -We are checking some labs to evaluate your fatigue.  If they are normal, I will send you a MyChart message.  If they are concerning I will call you.  Take care and seek immediate care sooner if you develop any concerns.   Dr. Glenn Lange, DO [facility]

## 2023-11-20 NOTE — Telephone Encounter (Addendum)
 Pharmacy Patient Advocate Encounter   Received notification from CoverMyMeds that prior authorization for ZEPBOUND 10MG  is required/requested.   Insurance verification completed.   The patient is insured through [facility] .   PA required; PA submitted to above mentioned insurance via CoverMyMeds Key/confirmation #/EOC [id number]. Status is pending

## 2023-11-21 ENCOUNTER — Telehealth: Payer: Self-pay | Admitting: Student

## 2023-11-21 DIAGNOSIS — N3941 Urge incontinence: Secondary | ICD-10-CM

## 2023-11-21 LAB — CBC WITH DIFFERENTIAL/PLATELET
Basophils Absolute: 0 10*3/uL (ref 0.0–0.2)
Basos: 1 %
EOS (ABSOLUTE): 0 10*3/uL (ref 0.0–0.4)
Eos: 1 %
Hematocrit: 37.9 % (ref 34.0–46.6)
Hemoglobin: 12.4 g/dL (ref 11.1–15.9)
Immature Grans (Abs): 0 10*3/uL (ref 0.0–0.1)
Immature Granulocytes: 0 %
Lymphocytes Absolute: 1.6 10*3/uL (ref 0.7–3.1)
Lymphs: 34 %
MCH: 30.6 pg (ref 26.6–33.0)
MCHC: 32.7 g/dL (ref 31.5–35.7)
MCV: 94 fL (ref 79–97)
Monocytes Absolute: 0.4 10*3/uL (ref 0.1–0.9)
Monocytes: 8 %
Neutrophils Absolute: 2.7 10*3/uL (ref 1.4–7.0)
Neutrophils: 56 %
Platelets: 238 10*3/uL (ref 150–450)
RBC: 4.05 x10E6/uL (ref 3.77–5.28)
RDW: 14 % (ref 11.7–15.4)
WBC: 4.8 10*3/uL (ref 3.4–10.8)

## 2023-11-21 LAB — BASIC METABOLIC PANEL WITH GFR
BUN/Creatinine Ratio: 13 (ref 12–28)
BUN: 10 mg/dL (ref 8–27)
CO2: 21 mmol/L (ref 20–29)
Calcium: 10.5 mg/dL — ABNORMAL HIGH (ref 8.7–10.3)
Chloride: 102 mmol/L (ref 96–106)
Creatinine, Ser: 0.8 mg/dL (ref 0.57–1.00)
Glucose: 101 mg/dL — ABNORMAL HIGH (ref 70–99)
Potassium: 4.4 mmol/L (ref 3.5–5.2)
Sodium: 141 mmol/L (ref 134–144)
eGFR: 82 mL/min/{1.73_m2} (ref >59)

## 2023-11-21 LAB — TSH: TSH: 1.78 u[IU]/mL (ref 0.450–4.500)

## 2023-11-21 NOTE — Telephone Encounter (Signed)
 Pharmacy Patient Advocate Encounter  Received notification from [facility] that Prior Authorization for ZEPBOUND 10MG  has been DENIED.  Full denial letter will be uploaded to the media tab. See denial reason below.  We may be able to approve this drug in certain situations (when you have had a trial and failure of Wegovy , including completion of 3 to 6 months of Wegovy  to complete dose titration and determine your side effect profile). We do not see that this applies to you. If this applies to you, we may need more information (if you have a baseline weight and body mass index measured within the past 45 days of this request; your baseline weight [including whether in pounds or kilograms] and date measured; if you are new to therapy or attempting a repeat weight loss course of therapy; if your body mass index is greater than a certain amount; if you have at least one severe weight-related condition/risk factor/complication; if you have a certain condition [established cardiovascular disease (CVD)]; if you are currently on and will continue lifestyle modifications [including structured nutrition and physical activity, unless physical activity is not appropriate when starting the requested drug]; if you will use the requested drug with another similar drug; if there are reasons you should not use the requested drug [Food and Drug Administration-labeled contraindications]). We based this decision on your health plan's prior authorization clinical criteria named GLP1s (glucagon-like peptide 1s) for Weight Management.   PA #/Case ID/Reference #: [id number]

## 2023-11-21 NOTE — Telephone Encounter (Signed)
 Called pt to discuss lab results showing mild hypercalcemia.  Pt scheduled for lab only visit to recheck calcium and also PTH Will also get UA to look for any signs of hematuria or stones to further evaluate her urinary incontinence though I have low concern for malignancy or stones given no gross hematuria or bladder irritation.

## 2023-11-22 ENCOUNTER — Telehealth: Payer: Self-pay | Admitting: Student

## 2023-11-22 DIAGNOSIS — Z6841 Body Mass Index (BMI) 40.0 and over, adult: Secondary | ICD-10-CM

## 2023-11-22 MED ORDER — SEMAGLUTIDE-WEIGHT MANAGEMENT 2.4 MG/0.75ML ~~LOC~~ SOAJ: 2.4000 mg | SUBCUTANEOUS | 3 refills | Status: DC

## 2023-11-22 NOTE — Telephone Encounter (Signed)
 Patient returns call to nurse line.   Advised of Zepbound denial.   If appropriate, patient would like to proceed with Wegovy .   Will forward to PCP for potential order placement.   Elsie Halo, RN

## 2023-11-22 NOTE — Telephone Encounter (Signed)
 Called patient discussed the following:  Insurance will not cover Zepbound for weight loss until patient has failed trial of Wegovy .  Patient has failed Ozempic  2 mg which she has been taking for the past year for both obesity and diabetes. Will send in prescription for Wegovy  2.4 mg for weight loss.  Patient advised to return for follow-up visit in 1 month to monitor urinary incontinence since increasing her Myrbetriq  and can see how she is tolerating the Wegovy .  Also advised weight check in 3 months.  If she does not have significant weight loss by then, would recommend trialing Zepbound.  In addition to Wegovy , she plans to do moderate intensity cardio for 15 minutes at least 3 times per week and follow the diabetic diet 4 nights out of the week which were discussed at her last visit.  Her current weight is 265 pounds 9.6 ounces.

## 2023-11-23 ENCOUNTER — Telehealth: Payer: Self-pay

## 2023-11-23 NOTE — Telephone Encounter (Signed)
 Pharmacy Patient Advocate Encounter   Received notification from CoverMyMeds that prior authorization for WEGOVY  2.4MG   is required/requested.   Insurance verification completed.   The patient is insured through [facility] .   PA required; PA submitted to above mentioned insurance via CoverMyMeds Key/confirmation #/EOC [id number]. Status is pending

## 2023-11-24 ENCOUNTER — Other Ambulatory Visit: Payer: Self-pay | Admitting: Student

## 2023-11-24 DIAGNOSIS — E119 Type 2 diabetes mellitus without complications: Secondary | ICD-10-CM

## 2023-11-26 NOTE — Telephone Encounter (Signed)
 Patient returns call to nurse line regarding Wegovy . Advised of approval.   Called pharmacy with approval.   Chiquita JAYSON English, RN

## 2023-11-26 NOTE — Telephone Encounter (Signed)
 Pharmacy Patient Advocate Encounter  Received notification from [facility] that Prior Authorization for WEGOVY  2.4MG  has been APPROVED from 11/23/23 to 05/21/24   PA #/Case ID/Reference #: [id number]

## 2023-11-27 ENCOUNTER — Other Ambulatory Visit: Payer: Self-pay

## 2023-11-27 ENCOUNTER — Ambulatory Visit: Payer: Self-pay | Admitting: Student

## 2023-11-27 DIAGNOSIS — N3941 Urge incontinence: Secondary | ICD-10-CM | POA: Diagnosis not present

## 2023-11-27 LAB — POCT URINE DIPSTICK
Bilirubin, UA: NEGATIVE
Blood, UA: NEGATIVE
Glucose, UA: NEGATIVE mg/dL
Ketones, POC UA: NEGATIVE mg/dL
Leukocytes, UA: NEGATIVE
Nitrite, UA: NEGATIVE
POC PROTEIN,UA: NEGATIVE
Spec Grav, UA: 1.015 (ref 1.010–1.025)
Urobilinogen, UA: 0.2 U/dL
pH, UA: 5.5 (ref 5.0–8.0)

## 2023-11-29 LAB — PTH, INTACT AND CALCIUM
Calcium: 9.9 mg/dL (ref 8.7–10.3)
PTH: 44 pg/mL (ref 15–65)

## 2023-12-10 NOTE — Progress Notes (Unsigned)
    SUBJECTIVE:   CHIEF COMPLAINT / HPI:   Diabetes Current Regimen: Metformin  1000 mg twice daily, Wegovy  2.4mg  weekly CBGs: Not checking, well controlled  Last A1c:  Lab Results  Component Value Date   HGBA1C 5.7 11/20/2023    Denies polyuria, polydipsia, hypoglycemia. Last Eye Exam: UTD - [facility] (went in 09/2023) Statin: Pravastatin  40mg  ACE/ARB: Lisinopril  20mg     PERTINENT  PMH / PSH: HTN, T2DM, obesity, HLD  OBJECTIVE:   BP 132/80   Pulse 85   Ht 5' 8 (1.727 m)   Wt 265 lb 3.2 oz (120.3 kg)   SpO2 100%   BMI 40.32 kg/m    General: NAD, pleasant, able to participate in exam Cardiac: RRR, no murmurs. Respiratory: CTAB, normal effort, No wheezes, rales or rhonchi Extremities: no edema or cyanosis. Skin: warm and dry, no rashes noted Neuro: alert, no obvious focal deficits Psych: Normal affect and mood  ASSESSMENT/PLAN:   Assessment & Plan Controlled type 2 diabetes mellitus without complication, without long-term current use of insulin (HCC) A1c 5.7 in 11/2023, very well-controlled on current regimen and discussed titrating off metformin  to reduce pill burden.  Patient agreeable, will cut metformin  dose in half and repeat A1c in 3 months and if stable will discontinue completely.  Doing well on Wegovy  2.5 mg weekly, will trend weight at follow-up. -Decrease to metformin  1000 mg daily -Continue Wegovy  2.5 mg weekly -ROA completed for diabetic eye exam -Follow-up in 3 months for A1c and weight trend Encounter for immunization Received pneumonia vaccine Primary hypertension 132/80, well-controlled on current regimen.   Dr. Izetta Nap, DO [facility]

## 2023-12-11 ENCOUNTER — Ambulatory Visit: Admitting: Family Medicine

## 2023-12-11 ENCOUNTER — Encounter: Payer: Self-pay | Admitting: Family Medicine

## 2023-12-11 VITALS — BP 132/80 | HR 85 | Ht 68.0 in | Wt 265.2 lb

## 2023-12-11 DIAGNOSIS — I10 Essential (primary) hypertension: Secondary | ICD-10-CM

## 2023-12-11 DIAGNOSIS — E119 Type 2 diabetes mellitus without complications: Secondary | ICD-10-CM

## 2023-12-11 DIAGNOSIS — Z23 Encounter for immunization: Secondary | ICD-10-CM

## 2023-12-11 MED ORDER — METFORMIN HCL 1000 MG PO TABS
1000.0000 mg | ORAL_TABLET | Freq: Every day | ORAL | Status: AC
Start: 2023-12-11 — End: ?

## 2023-12-11 NOTE — Assessment & Plan Note (Signed)
 132/80, well-controlled on current regimen.

## 2023-12-11 NOTE — Patient Instructions (Signed)
 It was wonderful to see you today! Thank you for choosing [facility].   Please bring ALL of your medications with you to every visit.   Today we talked about:  Your diabetes is doing very well!  I do think since you are on the maximum dose of the Wegovy  it is unlikely the metformin  is doing very much for you.  Lets cut your metformin  down to 1 tablet daily and follow-up for your next A1c in September.  If your A1c is still doing well I think we can take you off the metformin  completely. We will get the records from your eye doctor so we have your diabetic eye exam on file. You received the pneumonia vaccine today. Your blood pressure looks good, we we will continue to monitor it in the office.  Please follow up in 3 months   Call the clinic at [phone] if your symptoms worsen or you have any concerns.  Please be sure to schedule follow up at the front desk before you leave today.   Izetta Nap, DO Family Medicine

## 2024-02-22 NOTE — Progress Notes (Signed)
 63 y.o. [id number] Single African American female here for annual exam. Having some bladder issues, leaking mainly.  No leakage with cough, laugh, or sneeze.  Wearing a pad daily.   Up at night to void several times.    Had Rx for Myrbetriq  from her PCP, and she stopped after 2 months.   It was not helping her.    No hx glaucoma and hx of arrhythmia.  l  Has DM. A1C 5.7 on 11/20/23.    Patient had 2 paps which had benign glandular cells following radical hysterectomy and XRT. Colposcopy with Dr. Lavoie on 12/06/22 showed squamous atypia, favor reactive.  No dysplasia or malignancy seen.  Her HR HPV testing was negative.  Pap 02/12/23 normal and neg HR HPV.   PCP: Nicholas Bar, MD   No LMP recorded. Patient has had a hysterectomy.           Sexually active: No.  The current method of family planning is status post hysterectomy.    Menopausal hormone therapy:  n/a Exercising: No.   Smoker:  Former   OB History  Gravida Para Term Preterm AB Living  2 2 2   2   SAB IAB Ectopic Multiple Live Births          # Outcome Date GA Lbr Len/2nd Weight Sex Type Anes PTL Lv  2 Term           1 Term              HEALTH MAINTENANCE: Last 2 paps:  02/12/23 neg HR HPV neg, 10/24/22 neg  History of abnormal Pap or positive HPV:  no Mammogram:   05/10/23 Breast Density Cat A, BIRADS Cat 2 benign  Colonoscopy:  06/04/23 - tubular adenoma Bone Density:  10/03/23  Result  normal    Immunization History  Administered Date(s) Administered   Influenza,inj,Quad PF,6+ Mos 04/13/2015, 08/14/2018, 03/21/2019   Moderna Sars-Covid-2 Vaccination 06/11/2019, 07/09/2019   PNEUMOCOCCAL CONJUGATE-20 12/11/2023   Pneumococcal Polysaccharide-23 08/14/2018   Tdap 08/14/2018      reports that she quit smoking about 27 years ago. Her smoking use included cigarettes. She has been exposed to tobacco smoke. She has never used smokeless tobacco. She reports current alcohol use. She reports that she does not use  drugs.  Past Medical History:  Diagnosis Date   Arthritis    Cervical cancer (HCC) 2007   s/p partial hysterectomy and radiation   Diabetes mellitus without complication (HCC)    Hyperlipidemia    Hypertension    Sickle cell trait (HCC)     Past Surgical History:  Procedure Laterality Date   ABDOMINAL HYSTERECTOMY  2007   for cervical cancer; radical   BACK SURGERY     COLONOSCOPY     JOINT REPLACEMENT     left hip replacement 3-5 years ago    Current Outpatient Medications  Medication Sig Dispense Refill   amLODipine  (NORVASC ) 10 MG tablet Take 1 tablet (10 mg total) by mouth daily. 90 tablet 3   lisinopril  (ZESTRIL ) 20 MG tablet Take 1 tablet (20 mg total) by mouth daily. 90 tablet 3   metFORMIN  (GLUCOPHAGE ) 1000 MG tablet Take 1 tablet (1,000 mg total) by mouth daily with breakfast.     oxybutynin  (DITROPAN  XL) 10 MG 24 hr tablet Take 1 tablet (10 mg total) by mouth at bedtime. 30 tablet 2   pravastatin  (PRAVACHOL ) 40 MG tablet Take 1 tablet (40 mg total) by mouth daily. 90 tablet 3  Semaglutide -Weight Management 2.4 MG/0.75ML SOAJ Inject 2.4 mg into the skin once a week. 3 mL 3   No current facility-administered medications for this visit.    ALLERGIES: Penicillins  Family History  Problem Relation Age of Onset   Cancer Mother        breast cancer, died at 67yo   Breast cancer Mother 25   Seizures Father    Diabetes Maternal Aunt    Diabetes Maternal Uncle    Diabetes Brother    Colon cancer Neg Hx    Colon polyps Neg Hx    Esophageal cancer Neg Hx    Rectal cancer Neg Hx    Stomach cancer Neg Hx     Review of Systems  All other systems reviewed and are negative.   PHYSICAL EXAM:  BP 130/84 (BP Location: Left Arm, Patient Position: Sitting)   Pulse 92   Ht 5' 10 (1.778 m)   Wt 258 lb (117 kg)   SpO2 96%   BMI 37.02 kg/m     General appearance: alert, cooperative and appears stated age Head: normocephalic, without obvious abnormality,  atraumatic Neck: no adenopathy, supple, symmetrical, trachea midline and thyroid  normal to inspection and palpation Lungs: clear to auscultation bilaterally Breasts: normal appearance, no masses or tenderness, No nipple retraction or dimpling, No nipple discharge or bleeding, No axillary adenopathy Heart: regular rate and rhythm Abdomen: soft, non-tender; no masses, no organomegaly Extremities: extremities normal, atraumatic, no cyanosis or edema Skin: skin color, texture, turgor normal. No rashes or lesions Lymph nodes: cervical, supraclavicular, and axillary nodes normal. Neurologic: grossly normal  Pelvic: External genitalia:  no lesions              No abnormal inguinal nodes palpated.              Urethra:  normal appearing urethra with no masses, tenderness or lesions              Bartholins and Skenes: normal                 Vagina: normal appearing vagina with normal color and discharge, no lesions              Cervix: absent              Pap taken: no Bimanual Exam:  Uterus:  absent              Adnexa: no mass, fullness, tenderness              Rectal exam: yes.  Confirms.              Anus:  normal sphincter tone, no lesions  Chaperone was present for exam:  Kari HERO, CMA  ASSESSMENT: Well woman visit with gynecologic exam. Status post radical hysterectomy for cervical cancer.  Status post radiation therapy.  2 paps showing adenosis of the vaginal followed by colpo biopsy showing atypia, favor reactive.  Follow up pap normal and neg HR HPV. Cervical cancer screening today.  FH breast cancer in mother.   Colon cancer screening.  Hx polyps. PHQ-2-9: 0  PLAN: Mammogram screening discussed. Self breast awareness reviewed. Pap and HRV collected:  yes Guidelines for Calcium, Vitamin D, regular exercise program including cardiovascular and weight bearing exercise. Overactive bladder discussed.  Bladder irritants reviewed.  Medication refills:  Stopped Myrbetriq .  Start  Ditropan  XL 10 mg daily.  Potential side effects discussed.  Will check urinalysis and reflex cx today.  Labs with  PCP. Follow up:  in 6 weeks for a recheck and then yearly.

## 2024-02-25 ENCOUNTER — Other Ambulatory Visit (HOSPITAL_COMMUNITY)
Admission: RE | Admit: 2024-02-25 | Discharge: 2024-02-25 | Disposition: A | Discharge status: Home / Self Care | Source: Ambulatory Visit | Attending: Obstetrics and Gynecology | Admitting: Obstetrics and Gynecology

## 2024-02-25 ENCOUNTER — Ambulatory Visit (INDEPENDENT_AMBULATORY_CARE_PROVIDER_SITE_OTHER): Admitting: Obstetrics and Gynecology

## 2024-02-25 ENCOUNTER — Encounter: Payer: Self-pay | Admitting: Obstetrics and Gynecology

## 2024-02-25 VITALS — BP 130/84 | HR 92 | Ht 70.0 in | Wt 258.0 lb

## 2024-02-25 DIAGNOSIS — Z1331 Encounter for screening for depression: Secondary | ICD-10-CM

## 2024-02-25 DIAGNOSIS — Z01419 Encounter for gynecological examination (general) (routine) without abnormal findings: Secondary | ICD-10-CM | POA: Diagnosis not present

## 2024-02-25 DIAGNOSIS — Z8541 Personal history of malignant neoplasm of cervix uteri: Secondary | ICD-10-CM

## 2024-02-25 DIAGNOSIS — Z1272 Encounter for screening for malignant neoplasm of vagina: Secondary | ICD-10-CM | POA: Insufficient documentation

## 2024-02-25 DIAGNOSIS — N3281 Overactive bladder: Secondary | ICD-10-CM | POA: Diagnosis not present

## 2024-02-25 LAB — URINALYSIS, COMPLETE W/RFL CULTURE
Bacteria, UA: NONE SEEN /HPF
Bilirubin Urine: NEGATIVE
Glucose, UA: NEGATIVE
Hgb urine dipstick: NEGATIVE
Hyaline Cast: NONE SEEN /LPF
Ketones, ur: NEGATIVE
Leukocyte Esterase: NEGATIVE
Nitrites, Initial: NEGATIVE
Protein, ur: NEGATIVE
RBC / HPF: NONE SEEN /HPF (ref 0–2)
Specific Gravity, Urine: 1.015 (ref 1.001–1.035)
WBC, UA: NONE SEEN /HPF (ref 0–5)
pH: 5.5 (ref 5.0–8.0)

## 2024-02-25 LAB — NO CULTURE INDICATED

## 2024-02-25 MED ORDER — OXYBUTYNIN CHLORIDE ER 10 MG PO TB24: 10.0000 mg | ORAL_TABLET | Freq: Every day | ORAL | 2 refills | Status: AC

## 2024-02-25 NOTE — Patient Instructions (Addendum)
 Oxybutynin  Extended-Release Tablets What is this medication? OXYBUTYNIN  (ox i BYOO ti nin) treats symptoms of an overactive bladder, such as loss of bladder control or frequent need to urinate. It works by relaxing muscles in the bladder. It belongs to a group of medications called antispasmodics. This medicine may be used for other purposes; ask your health care provider or pharmacist if you have questions. COMMON BRAND NAME(S): Ditropan  XL What should I tell my care team before I take this medication? They need to know if you have any of these conditions: Autonomic neuropathy Dementia Glaucoma Intestinal obstruction Kidney disease Liver disease Myasthenia gravis Parkinson's disease Trouble passing urine An unusual or allergic reaction to oxybutynin , other medications, foods, dyes, or preservatives Pregnant or trying to get pregnant Breast-feeding How should I use this medication? Take this medication by mouth with a glass of water. Swallow whole, do not crush, cut, or chew. Follow the directions on the prescription label. You can take this medication with or without food. Take your doses at regular intervals. Do not take your medication more often than directed. Talk to your care team about the use of this medication in children. Special care may be needed. While this medication may be prescribed for children as young as 6 years for selected conditions, precautions do apply. Overdosage: If you think you have taken too much of this medicine contact a poison control center or emergency room at once. NOTE: This medicine is only for you. Do not share this medicine with others. What if I miss a dose? If you miss a dose, take it as soon as you can. If it is almost time for your next dose, take only that dose. Do not take double or extra doses. What may interact with this medication? Antihistamines for allergy, cough, and cold Atropine Certain medications for bladder problems, such as  oxybutynin  or tolterodine Certain medications for Parkinson disease, such as benztropine or trihexyphenidyl Certain medications for stomach problems, such as dicyclomine or hyoscyamine Certain medications for travel sickness, such as scopolamine Clarithromycin Erythromycin Ipratropium Medications for fungal infections, such as fluconazole, itraconazole, ketoconazole, or voriconazole This list may not describe all possible interactions. Give your health care provider a list of all the medicines, herbs, non-prescription drugs, or dietary supplements you use. Also tell them if you smoke, drink alcohol, or use illegal drugs. Some items may interact with your medicine. What should I watch for while using this medication? Visit your care team for regular checks on your progress. It may take a few weeks to notice the full benefit from this medication. You may need to limit your intake of tea, coffee, caffeinated sodas, and alcohol. These drinks may make your symptoms worse. This medication may affect your coordination, reaction time, or judgment. Do not drive or operate machinery until you know how this medication affects you. Sit up or stand slowly to reduce the risk of dizzy or fainting spells. Drinking alcohol with this medication can increase the risk of these side effects. Your mouth may get dry. Chewing sugarless gum or sucking hard candy and drinking plenty of water may help. Contact your care team if the problem does not go away or is severe. This medication may cause dry eyes and blurred vision. If you wear contact lenses, you may feel some discomfort. Lubricating eye drops may help. See your care team if the problem does not go away or is severe. You may notice the shells of the tablets in your stool from time to time.  This is normal. Avoid extreme heat. This medication can cause you to sweat less than normal. Your body temperature could increase to dangerous levels, which may lead to heat  stroke. What side effects may I notice from receiving this medication? Side effects that you should report to your care team as soon as possible: Allergic reactions or angioedema--skin rash, itching, hives, swelling of the face, eyes, lips, tongue, arms, or legs, trouble swallowing or breathing Sudden eye pain or change in vision such as blurry vision, seeing halos around lights, vision loss Trouble passing urine Side effects that usually do not require medical attention (report to your care team if they continue or are bothersome): Confusion Constipation Dizziness Drowsiness Dry mouth Headache This list may not describe all possible side effects. Call your doctor for medical advice about side effects. You may report side effects to FDA at 1-800-FDA-1088. Where should I keep my medication? Keep out of the reach of children. Store at room temperature between 15 and 30 degrees C (59 and 86 degrees F). Protect from moisture and humidity. Throw away any unused medication after the expiration date. NOTE: This sheet is a summary. It may not cover all possible information. If you have questions about this medicine, talk to your doctor, pharmacist, or health care provider.  2024 Elsevier/Gold Standard (2021-11-30 00:00:00)  Overactive Bladder in Adults: What to Know  Overactive bladder (OAB) is when you have trouble holding your pee. You may feel the need to pee often or all of a sudden. You may also leak pee if you can't get to a bathroom fast enough. These symptoms may get in the way of your daily life. What are the causes? OAB may be caused by poor nerve signals between your bladder and your brain. Your bladder may get the signal to empty before it's full. Or, your bladder may be too sensitive. This can cause it to squeeze too soon. Other causes include: Medical problems, such as: A urinary tract infection (UTI). Swelling or stones in your bladder. A tumor, which is a growth of cells that  isn't normal. Diabetes. Muscle or nerve weakness. This may be caused by: An injury to your spinal cord. A stroke. Multiple sclerosis. Surgery on nearby tissues. Drinking caffeine or alcohol. Some medicines. These include ones to lower the amount of fluid in your body. Trouble pooping, also called constipation. What increases the risk? You may be more at risk if: You're an older adult. You're going through menopause. You have a big prostate or problems with your prostate. You're overweight. You smoke. You eat or drink things that irritate your bladder. These may include tea or spicy food. What are the signs or symptoms? A sudden, strong need to pee. Peeing 8 or more times a day. Leaking pee. Waking up to pee 2 or more times a night. How is this diagnosed? You may be diagnosed based on your symptoms, medical history, and an exam. You may also have tests. These may include: Blood tests. Tests of your pee to check for an infection. Tests to check the flow of your pee. These are called urodynamic tests. You may also need to see an expert in problems with the bladder called a urologist. How is this treated? Treatment depends on the cause and how bad your symptoms are. It may include: Bladder training, such as: Learning to control the urge to pee by peeing at certain times. Doing Kegel exercises. These can help you strengthen the muscles that start and stop the  flow of pee. Special devices, such as: Biofeedback. This uses sensors to help you learn when to pee. Electrical stimulation. This uses electricity to make the nerves and muscles of your bladder work. A pessary. This can be put in the vagina to support the bladder. Medicines to: Treat a UTI. Stop the bladder from releasing pee at the wrong time. Calm the bladder muscles. Surgery to: Help the nerve signals that control peeing. Change the shape of your bladder. This is done only in very bad cases. Follow these instructions  at home: Eating and drinking  Make changes to your diet as told. You may need to: Drink small amounts of fluid during the day rather than large amounts at one time. Take in less caffeine or alcohol. Take steps to help treat or prevent trouble pooping. You may need to eat foods high in fiber, like beans, whole grains, and fresh fruits and vegetables. Lifestyle Lose weight if needed. Do not smoke, vape, or use nicotine or tobacco. General instructions Take your medicines only as told. If you were given antibiotics, take them as told. Do not stop taking them even if you start to feel better. Use devices as told. If needed, wear pads to absorb pee leakage. Keep track of how much you drink, when you drink, and when you need to pee. Contact a health care provider if: Your symptoms don't get better with treatment. You can't control your bladder. You have a fever or chills. This information is not intended to replace advice given to you by your health care provider. Make sure you discuss any questions you have with your health care provider. Document Revised: 12/18/2022 Document Reviewed: 12/18/2022 Elsevier Patient Education  2024 Elsevier Inc.  EXERCISE AND DIET:  We recommended that you start or continue a regular exercise program for good health. Regular exercise means any activity that makes your heart beat faster and makes you sweat.  We recommend exercising at least 30 minutes per day at least 3 days a week, preferably 4 or 5.  We also recommend a diet low in fat and sugar.  Inactivity, poor dietary choices and obesity can cause diabetes, heart attack, stroke, and kidney damage, among others.    ALCOHOL AND SMOKING:  Women should limit their alcohol intake to no more than 7 drinks/beers/glasses of wine (combined, not each!) per week. Moderation of alcohol intake to this level decreases your risk of breast cancer and liver damage. And of course, no recreational drugs are part of a healthy  lifestyle.  And absolutely no smoking or even second hand smoke. Most people know smoking can cause heart and lung diseases, but did you know it also contributes to weakening of your bones? Aging of your skin?  Yellowing of your teeth and nails?  CALCIUM AND VITAMIN D:  Adequate intake of calcium and Vitamin D are recommended.  The recommendations for exact amounts of these supplements seem to change often, but generally speaking 600 mg of calcium (either carbonate or citrate) and 800 units of Vitamin D per day seems prudent. Certain women may benefit from higher intake of Vitamin D.  If you are among these women, your doctor will have told you during your visit.    PAP SMEARS:  Pap smears, to check for cervical cancer or precancers,  have traditionally been done yearly, although recent scientific advances have shown that most women can have pap smears less often.  However, every woman still should have a physical exam from her gynecologist every  year. It will include a breast check, inspection of the vulva and vagina to check for abnormal growths or skin changes, a visual exam of the cervix, and then an exam to evaluate the size and shape of the uterus and ovaries.  And after 65 years of age, a rectal exam is indicated to check for rectal cancers. We will also provide age appropriate advice regarding health maintenance, like when you should have certain vaccines, screening for sexually transmitted diseases, bone density testing, colonoscopy, mammograms, etc.   MAMMOGRAMS:  All women over 3 years old should have a yearly mammogram. Many facilities now offer a 3D mammogram, which may cost around $50 extra out of pocket. If possible,  we recommend you accept the option to have the 3D mammogram performed.  It both reduces the number of women who will be called back for extra views which then turn out to be normal, and it is better than the routine mammogram at detecting truly abnormal areas.    COLONOSCOPY:   Colonoscopy to screen for colon cancer is recommended for all women at age 59.  We know, you hate the idea of the prep.  We agree, BUT, having colon cancer and not knowing it is worse!!  Colon cancer so often starts as a polyp that can be seen and removed at colonscopy, which can quite literally save your life!  And if your first colonoscopy is normal and you have no family history of colon cancer, most women don't have to have it again for 10 years.  Once every ten years, you can do something that may end up saving your life, right?  We will be happy to help you get it scheduled when you are ready.  Be sure to check your insurance coverage so you understand how much it will cost.  It may be covered as a preventative service at no cost, but you should check your particular policy.

## 2024-02-26 ENCOUNTER — Ambulatory Visit: Payer: Self-pay | Admitting: Obstetrics and Gynecology

## 2024-02-27 LAB — CYTOLOGY - PAP
Comment: NEGATIVE
Diagnosis: NEGATIVE
High risk HPV: NEGATIVE

## 2024-03-05 ENCOUNTER — Other Ambulatory Visit: Payer: Self-pay | Admitting: *Deleted

## 2024-03-05 DIAGNOSIS — Z6841 Body Mass Index (BMI) 40.0 and over, adult: Secondary | ICD-10-CM

## 2024-03-05 MED ORDER — SEMAGLUTIDE-WEIGHT MANAGEMENT 2.4 MG/0.75ML ~~LOC~~ SOAJ: 2.4000 mg | SUBCUTANEOUS | 3 refills | Status: DC

## 2024-03-06 ENCOUNTER — Ambulatory Visit: Admitting: Family Medicine

## 2024-03-12 ENCOUNTER — Ambulatory Visit
Admission: RE | Admit: 2024-03-12 | Discharge: 2024-03-12 | Disposition: A | Discharge status: Home / Self Care | Source: Ambulatory Visit | Attending: Family Medicine | Admitting: Family Medicine

## 2024-03-12 ENCOUNTER — Ambulatory Visit: Payer: Self-pay | Admitting: Family Medicine

## 2024-03-12 ENCOUNTER — Ambulatory Visit: Admitting: Family Medicine

## 2024-03-12 ENCOUNTER — Encounter: Payer: Self-pay | Admitting: Family Medicine

## 2024-03-12 VITALS — BP 117/86 | HR 89 | Ht 68.0 in | Wt 260.4 lb

## 2024-03-12 DIAGNOSIS — M79642 Pain in left hand: Secondary | ICD-10-CM | POA: Diagnosis not present

## 2024-03-12 DIAGNOSIS — R202 Paresthesia of skin: Secondary | ICD-10-CM

## 2024-03-12 DIAGNOSIS — R200 Anesthesia of skin: Secondary | ICD-10-CM

## 2024-03-12 DIAGNOSIS — M79641 Pain in right hand: Secondary | ICD-10-CM

## 2024-03-12 DIAGNOSIS — Z23 Encounter for immunization: Secondary | ICD-10-CM | POA: Diagnosis not present

## 2024-03-12 DIAGNOSIS — E119 Type 2 diabetes mellitus without complications: Secondary | ICD-10-CM | POA: Diagnosis present

## 2024-03-12 LAB — POCT GLYCOSYLATED HEMOGLOBIN (HGB A1C): HbA1c, POC (controlled diabetic range): 5.6 % (ref 0.0–7.0)

## 2024-03-12 MED ORDER — TRULICITY 4.5 MG/0.5ML ~~LOC~~ SOAJ: 4.5000 mg | SUBCUTANEOUS | 1 refills | Status: DC

## 2024-03-12 MED ORDER — ZIKS ARTHRITIS PAIN RELIEF 0.025-1-12 % EX CREA
1.0000 | TOPICAL_CREAM | CUTANEOUS | 0 refills | Status: AC | PRN
Start: 2024-03-12 — End: ?

## 2024-03-12 NOTE — Patient Instructions (Addendum)
 It was wonderful to see you today.  Please bring ALL of your medications with you to every visit.   Today we talked about: VISIT SUMMARY: Today, you were seen for hand pain and tingling, as well as a follow-up on your diabetes management. We discussed your symptoms, reviewed your current medications, and made some adjustments to your treatment plan.  YOUR PLAN: -HAND PAIN AND NUMBNESS: Your hand pain and numbness are likely due to osteoarthritis, which is a condition caused by wear and tear on the joints. We will order hand x-rays to check for arthritis. You should continue using ibuprofen as needed for pain, but not more than once a week. Additionally, we are prescribing capsaicin cream to help with the tingling; be careful to avoid contact with your eyes. Physical or occupational therapy may also be beneficial for hand exercises, and you can use cold or heat to manage inflammation.   -TYPE 2 DIABETES MELLITUS: Your diabetes is well-controlled with your current medication, Wegovy , which has helped lower your A1c from 6.5% to 5.7%. However, due to insurance issues, we will switch you to Trulicity 4.5 mg weekly as an interim solution, with a one-month supply and one refill. Please continue focusing on your nutrition and exercise, including increasing your protein intake and using the [employer] program for gym access.  INSTRUCTIONS: We will recheck your A1c today. Please follow up with us  after you have started the new medication to monitor your progress. If you experience any new symptoms or have concerns, contact our office.  Contains text generated by [employer].   Please follow up in 1-2 months   Thank you for choosing [facility].   Please call [phone] with any questions about today's appointment.  Please be sure to schedule follow up at the front desk before you leave today.   Areta Saliva, MD  Family Medicine

## 2024-03-12 NOTE — Progress Notes (Unsigned)
 SUBJECTIVE:   CHIEF COMPLAINT / HPI:   Discussed the use of AI scribe software for clinical note transcription with the patient, who gave verbal consent to proceed.  History of Present Illness [name] is a 63 year old female with diabetes who presents with hand pain and tingling, and for a follow-up on her diabetes management.  She has been experiencing hand pain and tingling for about a year, with the right hand being more affected. The pain is described as being located 'all up in here' and is accompanied by numbness in the fingers. Symptoms worsen at the end of the day and sometimes interfere with tasks like picking up a cup. No radiation of pain up the arms and no locking of the fingers. Gabapentin  was tried previously without relief, and she uses ibuprofen 1000 mg as needed, approximately once a week.  She has a history of diabetes and has been managing it with Wegovy  for about four months, resulting in a weight loss of five to ten pounds. Her A1c improved from 6.5 two years ago to 5.7 three months ago. She previously tried Ozempic  but found it ineffective. She is not on metformin  because her previous clinician did not see a need for it, and she did not experience side effects from it.  She is transitioning from [facility] to Medicare, effective November 1st. She is retired and plans to increase her physical activity by utilizing the [employer] program once her Medicare is active. Her nutrition is 'going all right' and she is working on increasing her protein intake. No known family history of rheumatoid arthritis. No sleep apnea, excessive daytime sleepiness, or heart problems.      PERTINENT  PMH / PSH: ***  OBJECTIVE:   BP 117/86   Pulse 89   Ht 5' 8 (1.727 m)   Wt 260 lb 6.4 oz (118.1 kg)   SpO2 100%   BMI 39.59 kg/m   *** Physical Exam CARDIOVASCULAR: Heart sounds normal. MUSCULOSKELETAL: Grinding and pain in hand joints on  movement.    ASSESSMENT/PLAN:   Assessment & Plan Controlled type 2 diabetes mellitus without complication, without long-term current use of insulin (HCC)  Numbness and tingling in both hands  Bilateral hand pain      Assessment and Plan Assessment & Plan Hand pain and numbness, likely osteoarthritis Chronic hand pain and numbness, likely osteoarthritis due to wear and tear pattern and grinding sensation. Negative Tinel's test suggests not carpal tunnel syndrome. - Order hand x-rays to assess for arthritis. - Recommend ibuprofen as needed for pain, not exceeding once a week. - Prescribe capsaicin cream for tingling, caution to avoid contact with eyes. - Discuss potential benefit of physical or occupational therapy for hand exercises. - Advise use of cold or heat for inflammation management.  Knee osteoarthritis Chronic knee osteoarthritis with bone-on-bone changes. - Continue ibuprofen as needed for knee pain, not exceeding once a week.  Type 2 diabetes mellitus Type 2 diabetes mellitus with improved control on Wegovy , A1c reduced from 6.5% to 5.7%. Insurance issues with Wegovy . Previous Ozempic  trial ineffective. Plan to switch to Mounjaro , insurance may require prior authorization. Trulicity covered by insurance as interim solution. Discussed starting Mounjaro  at 10 mg due to previous low dose ineffectiveness and potential side effects. - Discontinue Wegovy  due to insurance coverage issues. - Prescribe Trulicity 4.5 mg weekly as an interim solution, with a one-month supply and one refill. - Attempt prior authorization for Mounjaro , starting at 10 mg if approved. -  Recheck A1c today. - Encourage continued focus on nutrition and exercise, including increasing protein intake and utilizing [employer] program for gym access.    Areta Saliva, MD [facility]

## 2024-03-20 NOTE — Telephone Encounter (Signed)
 Called patient regarding hand xrays.  She had some degenerative changes and some changes consistent with arthritis.  Will refer patient to sports medicine for possible injection versus other therapies.  Patient in agreement.

## 2024-04-07 ENCOUNTER — Encounter: Payer: Self-pay | Admitting: Radiology

## 2024-04-10 ENCOUNTER — Other Ambulatory Visit (HOSPITAL_COMMUNITY): Payer: Self-pay

## 2024-04-10 ENCOUNTER — Ambulatory Visit: Admitting: Obstetrics and Gynecology

## 2024-04-10 ENCOUNTER — Telehealth: Payer: Self-pay

## 2024-04-10 ENCOUNTER — Other Ambulatory Visit: Payer: Self-pay | Admitting: Obstetrics and Gynecology

## 2024-04-10 DIAGNOSIS — Z1231 Encounter for screening mammogram for malignant neoplasm of breast: Secondary | ICD-10-CM

## 2024-04-10 NOTE — Telephone Encounter (Signed)
 Prior authorization submitted for TRULICITY 4.5MG  to [employer] via Lat[employer].  Insurance has name spelled as Myrick  Key: [id number]

## 2024-04-11 NOTE — Telephone Encounter (Signed)
 Pharmacy Patient Advocate Encounter  Received notification from [employer] that Prior Authorization for TRULICITY 4.5MG  has been APPROVED from 04/10/24 to 06/04/25   PA #/Case ID/Reference #: [id number]

## 2024-04-22 ENCOUNTER — Encounter: Payer: Self-pay | Admitting: Family Medicine

## 2024-04-22 ENCOUNTER — Ambulatory Visit: Admitting: Family Medicine

## 2024-04-22 VITALS — BP 132/87 | Ht 68.0 in | Wt 260.0 lb

## 2024-04-22 DIAGNOSIS — E1140 Type 2 diabetes mellitus with diabetic neuropathy, unspecified: Secondary | ICD-10-CM

## 2024-04-22 DIAGNOSIS — M19031 Primary osteoarthritis, right wrist: Secondary | ICD-10-CM

## 2024-04-22 DIAGNOSIS — M19032 Primary osteoarthritis, left wrist: Secondary | ICD-10-CM

## 2024-04-22 DIAGNOSIS — G5603 Carpal tunnel syndrome, bilateral upper limbs: Secondary | ICD-10-CM | POA: Diagnosis not present

## 2024-04-22 MED ORDER — MELOXICAM 15 MG PO TABS: 15.0000 mg | ORAL_TABLET | Freq: Every day | ORAL | 0 refills | Status: AC | PRN

## 2024-04-22 NOTE — Progress Notes (Signed)
 PCP: Nicholas Bar, MD  Subjective:   HPI: Patient is a 63 y.o. female here for bilateral hand pain.  Patient states this pain has been present for a long time but worsening over the last few months.  She states it is a tingling and numbness pain that goes through her 1st through 4th fingers, with some pain at her wrist as well periodically.  She does have a history of diabetes and was previously diagnosed with diabetic neuropathy, although she feels that this pain is different.  She denies any injury.  She currently does not take any medication for her tingling and numbness.  Past Medical History:  Diagnosis Date   Arthritis    Cervical cancer (HCC) 2007   s/p partial hysterectomy and radiation   Diabetes mellitus without complication (HCC)    Hyperlipidemia    Hypertension    Sickle cell trait     Current Outpatient Medications on File Prior to Visit  Medication Sig Dispense Refill   amLODipine  (NORVASC ) 10 MG tablet Take 1 tablet (10 mg total) by mouth daily. 90 tablet 3   Capsaicin-Menthol-Methyl Sal (CAPSAICIN-METHYL SAL-MENTHOL) 0.025-1-12 % CREA Apply 1 Application topically as needed. 56.6 g 0   Dulaglutide (TRULICITY) 4.5 MG/0.5ML SOAJ Inject 4.5 mg as directed once a week. 2 mL 1   lisinopril  (ZESTRIL ) 20 MG tablet Take 1 tablet (20 mg total) by mouth daily. 90 tablet 3   metFORMIN  (GLUCOPHAGE ) 1000 MG tablet Take 1 tablet (1,000 mg total) by mouth daily with breakfast.     oxybutynin  (DITROPAN  XL) 10 MG 24 hr tablet Take 1 tablet (10 mg total) by mouth at bedtime. 30 tablet 2   pravastatin  (PRAVACHOL ) 40 MG tablet Take 1 tablet (40 mg total) by mouth daily. 90 tablet 3   No current facility-administered medications on file prior to visit.    BP 132/87   Ht 5' 8 (1.727 m)   Wt 260 lb (117.9 kg)   BMI 39.53 kg/m        Objective:   Physical Exam:  Gen: NAD, comfortable in exam room Bilateral hands Inspection: Bilateral hands are warm and well-perfused, no  erythema or edema Palpation: No tenderness to palpation ROM: She has full range of motion of her wrist with flexion, extension, inversion and eversion Special Tests: Tinel's negative, Phalen's positive from the 1st through 4th digits with tingling and numbness, negative CMC grind bilaterally, negative scaphoid shift Neuro: Sensation is equal and intact over the palmar and dorsal side of her bilateral hands, grip strength is 5/5  X-ray of bilateral hands reviewed with patient and interpreted by me with some mild arthritic change present throughout the hands, as well as gapping of the scapholunate on the left  Assessment/Plan:   Odilia Damico is a 63 y.o. female who was seen today for the following: 1. Bilateral carpal tunnel syndrome (Primary) - Patient physical exam significant for carpal tunnel - Discussed treatment options including conservative versus injection therapy - Patient would like to pursue bracing, anti-inflammatories and home therapy - Discussed potential for added nerve medication versus injections - We will send in Mobic  15 mg to be taken as needed - Bilateral cock up wrist splints given to patient - She can return in 6 to 8 weeks for follow-up  Follow-up/Education:   Return in about 8 weeks (around 06/17/2024).   May return sooner as needed and encouraged to call/e-mail for additional questions or  worsening symptoms in the interim.  Krystal Lowing, DO Sports  Medicine Fellow 04/22/2024 10:14 AM

## 2024-05-03 ENCOUNTER — Other Ambulatory Visit: Payer: Self-pay | Admitting: Family Medicine

## 2024-05-03 DIAGNOSIS — E119 Type 2 diabetes mellitus without complications: Secondary | ICD-10-CM

## 2024-05-07 ENCOUNTER — Ambulatory Visit
Admission: RE | Admit: 2024-05-07 | Discharge: 2024-05-07 | Disposition: A | Source: Ambulatory Visit | Attending: Obstetrics and Gynecology | Admitting: Obstetrics and Gynecology

## 2024-05-07 DIAGNOSIS — Z1231 Encounter for screening mammogram for malignant neoplasm of breast: Secondary | ICD-10-CM

## 2024-05-13 ENCOUNTER — Ambulatory Visit: Payer: Self-pay | Admitting: Obstetrics and Gynecology

## 2024-06-03 ENCOUNTER — Ambulatory Visit: Admitting: Family Medicine

## 2024-06-09 ENCOUNTER — Ambulatory Visit: Admitting: Family Medicine

## 2024-06-09 ENCOUNTER — Encounter: Payer: Self-pay | Admitting: Family Medicine

## 2024-06-09 VITALS — BP 122/73 | Ht 68.0 in | Wt 280.0 lb

## 2024-06-09 DIAGNOSIS — M19032 Primary osteoarthritis, left wrist: Secondary | ICD-10-CM | POA: Diagnosis not present

## 2024-06-09 DIAGNOSIS — G5603 Carpal tunnel syndrome, bilateral upper limbs: Secondary | ICD-10-CM

## 2024-06-09 DIAGNOSIS — M19031 Primary osteoarthritis, right wrist: Secondary | ICD-10-CM | POA: Diagnosis not present

## 2024-06-09 DIAGNOSIS — E1140 Type 2 diabetes mellitus with diabetic neuropathy, unspecified: Secondary | ICD-10-CM

## 2024-06-09 MED ORDER — GABAPENTIN 300 MG PO CAPS: 300.0000 mg | ORAL_CAPSULE | Freq: Every day | ORAL | 1 refills | Status: AC

## 2024-06-09 NOTE — Progress Notes (Signed)
 DATE OF VISIT: 06/09/2024        [name] DOB: 1960/12/11 MRN: [id number]  Discussed the use of AI scribe software for clinical note transcription with the patient, who gave verbal consent to proceed.  History of Present Illness [name] is a 64 year old female with bilateral carpal tunnel syndrome, wrist osteoarthritis, and diabetic neuropathy who presents for follow-up of worsening bilateral hand symptoms.  Bilateral hand pain and paresthesia - Progressive worsening of pain, numbness, tingling, and burning sensations in both hands over the past six weeks - Symptoms described as feeling real hot, like she's on fire, especially at night - Involvement of all fingers bilaterally - Symptoms severe enough to disrupt sleep - Requires rubbing hands for relief during nighttime  Response to conservative management - No benefit from wrist splints - No improvement with home exercise program - Meloxicam  has not provided symptom relief - No prior use of gabapentin   Activity limitations and therapy barriers - Participates in aerobics classes twice weekly, including hand exercises similar to those recommended for therapy - Unable to pursue formal hand therapy due to high copays and financial constraints    Medications:  Outpatient Encounter Medications as of 06/09/2024  Medication Sig   gabapentin  (NEURONTIN ) 300 MG capsule Take 1 capsule (300 mg total) by mouth at bedtime.   amLODipine  (NORVASC ) 10 MG tablet Take 1 tablet (10 mg total) by mouth daily.   Capsaicin-Menthol-Methyl Sal (CAPSAICIN-METHYL SAL-MENTHOL) 0.025-1-12 % CREA Apply 1 Application topically as needed.   lisinopril  (ZESTRIL ) 20 MG tablet Take 1 tablet (20 mg total) by mouth daily.   meloxicam  (MOBIC ) 15 MG tablet Take 1 tablet (15 mg total) by mouth daily as needed for pain.   metFORMIN  (GLUCOPHAGE ) 1000 MG tablet Take 1 tablet (1,000 mg total) by mouth daily with breakfast.   oxybutynin  (DITROPAN  XL)  10 MG 24 hr tablet Take 1 tablet (10 mg total) by mouth at bedtime.   pravastatin  (PRAVACHOL ) 40 MG tablet Take 1 tablet (40 mg total) by mouth daily.   TRULICITY  4.5 MG/0.5ML SOAJ INJECT 4.5 MG  SUBCUTANEOUSLY ONCE A WEEK AS DIRECTED   No facility-administered encounter medications on file as of 06/09/2024.    Allergies: is allergic to penicillins.  Physical Examination: Vitals: BP 122/73   Ht 5' 8 (1.727 m)   Wt 280 lb (127 kg)   BMI 42.57 kg/m  GENERAL:  [name] is a 64 y.o. female appearing their stated age, alert and oriented x 3, in no apparent distress.  MSK: Bilateral hand and wrist without any gross deformity.  No thenar or hypothenar atrophy.  Full range of motion without pain.  Normal grip strength.  Normal intrinsic and extrinsic hand strength.  Negative Tinel's at the carpal tunnel bilaterally.  Mildly positive Phalen's bilaterally. NEURO: sensation intact to light touch upper extremity bilaterally VASC: pulses 2+ and symmetric radial artery bilaterally, no edema Assessment & Plan Bilateral carpal tunnel syndrome and wrist osteoarthritis that is complicated likely by underlying diabetic neuropathy Chronic bilateral carpal tunnel syndrome and wrist osteoarthritis with worsening neuropathic symptoms. Previous interventions ineffective.  Symptoms likely worsening due to diabetic neuropathy. - Continue wrist braces to reduce median nerve compression. - Discussed hand therapy; declined due to financial constraints. - Will start trial gabapentin  300 mg at bedtime.  Did explain can cause drowsiness.  She should reach out if making her too sleepy. - Follow-up in four weeks to reassess symptoms and response to new medication. - Possible  referral to hand specialist if symptoms worsen or fail to improve.  Type 2 diabetes mellitus with diabetic polyneuropathy Type 2 diabetes with well-controlled A1c, complicated by diabetic polyneuropathy causing nocturnal hand symptoms.  Neuropathy is likely primary contributor to her current symptoms - Prescribed gabapentin  300 mg at bedtime for neuropathic pain.  Educated on gabapentin  onset, dose titration, side effects (somnolence, grogginess). - Instructed to report excessive sedation or intolerance for dose adjustment. - Sent gabapentin  prescription to preferred pharmacy. - Continue diabetes management and monitoring. - Follow-up for 4 weeks for reevaluation  Patient expressed understanding & agreement with above.  Encounter Diagnoses  Name Primary?   Bilateral carpal tunnel syndrome Yes   Controlled type 2 diabetes mellitus with diabetic neuropathy (HCC)    Primary osteoarthritis of both wrists     No orders of the defined types were placed in this encounter.    Contains text generated by [employer].

## 2024-06-28 ENCOUNTER — Other Ambulatory Visit: Payer: Self-pay | Admitting: Family Medicine

## 2024-06-28 DIAGNOSIS — E119 Type 2 diabetes mellitus without complications: Secondary | ICD-10-CM

## 2024-07-07 ENCOUNTER — Ambulatory Visit: Admitting: Family Medicine

## 2025-02-26 ENCOUNTER — Ambulatory Visit: Admitting: Obstetrics and Gynecology
